# Patient Record
Sex: Female | Born: 1975
Health system: Southern US, Community
[De-identification: ages and names within clinical notes are randomized; demographics above are authoritative.]

## PROBLEM LIST (undated history)

## (undated) DIAGNOSIS — I82409 Acute embolism and thrombosis of unspecified deep veins of unspecified lower extremity: Secondary | ICD-10-CM

## (undated) DIAGNOSIS — R002 Palpitations: Secondary | ICD-10-CM

## (undated) DIAGNOSIS — O2230 Deep phlebothrombosis in pregnancy, unspecified trimester: Secondary | ICD-10-CM

## (undated) DIAGNOSIS — D259 Leiomyoma of uterus, unspecified: Secondary | ICD-10-CM

## (undated) DIAGNOSIS — M7989 Other specified soft tissue disorders: Secondary | ICD-10-CM

## (undated) HISTORY — DX: Leiomyoma of uterus, unspecified: D25.9

## (undated) HISTORY — DX: Palpitations: R00.2

## (undated) HISTORY — PX: OTHER SURGICAL HISTORY: SHX169

## (undated) HISTORY — PX: ABDOMINAL AORTA STENT: SHX1108

## (undated) HISTORY — DX: Deep phlebothrombosis in pregnancy, unspecified trimester: O22.30

---

## 2001-04-24 HISTORY — PX: THROMBECTOMY: PRO61

## 2007-06-05 ENCOUNTER — Other Ambulatory Visit: Admission: RE | Admit: 2007-06-05 | Discharge: 2007-06-05 | Payer: Self-pay | Admitting: Obstetrics and Gynecology

## 2008-07-21 ENCOUNTER — Ambulatory Visit: Payer: Self-pay | Admitting: Family Medicine

## 2008-07-21 DIAGNOSIS — D119 Benign neoplasm of major salivary gland, unspecified: Secondary | ICD-10-CM | POA: Insufficient documentation

## 2008-07-21 DIAGNOSIS — H0019 Chalazion unspecified eye, unspecified eyelid: Secondary | ICD-10-CM | POA: Insufficient documentation

## 2008-10-01 ENCOUNTER — Telehealth: Payer: Self-pay | Admitting: Family Medicine

## 2008-10-09 ENCOUNTER — Ambulatory Visit: Payer: Self-pay | Admitting: Family Medicine

## 2008-10-09 LAB — CONVERTED CEMR LAB
Albumin: 3.9 g/dL (ref 3.5–5.2)
Bilirubin Urine: NEGATIVE
Calcium: 8.9 mg/dL (ref 8.4–10.5)
Creatinine, Ser: 0.6 mg/dL (ref 0.4–1.2)
Eosinophils Absolute: 0 10*3/uL (ref 0.0–0.7)
Eosinophils Relative: 0.8 % (ref 0.0–5.0)
GFR calc non Af Amer: 120.6 mL/min (ref >60)
Glucose, Urine, Semiquant: NEGATIVE
HCT: 35.3 % — ABNORMAL LOW (ref 36.0–46.0)
HDL: 40.3 mg/dL (ref >39.00)
Hemoglobin: 12 g/dL (ref 12.0–15.0)
Lymphocytes Relative: 20.5 % (ref 12.0–46.0)
Monocytes Relative: 6.9 % (ref 3.0–12.0)
Neutro Abs: 4.3 10*3/uL (ref 1.4–7.7)
Neutrophils Relative %: 71.6 % (ref 43.0–77.0)
Platelets: 216 10*3/uL (ref 150.0–400.0)
Potassium: 3.4 meq/L — ABNORMAL LOW (ref 3.5–5.1)
RBC: 3.97 M/uL (ref 3.87–5.11)
Sodium: 137 meq/L (ref 135–145)
TSH: 0.83 microintl units/mL (ref 0.35–5.50)
Total CHOL/HDL Ratio: 3
Total Protein: 6.8 g/dL (ref 6.0–8.3)
Triglycerides: 48 mg/dL (ref 0.0–149.0)
VLDL: 9.6 mg/dL (ref 0.0–40.0)

## 2008-10-16 ENCOUNTER — Ambulatory Visit: Payer: Self-pay | Admitting: Family Medicine

## 2008-10-23 ENCOUNTER — Encounter: Payer: Self-pay | Admitting: Family Medicine

## 2008-10-23 ENCOUNTER — Ambulatory Visit: Payer: Self-pay | Admitting: Family Medicine

## 2008-10-23 DIAGNOSIS — D235 Other benign neoplasm of skin of trunk: Secondary | ICD-10-CM | POA: Insufficient documentation

## 2008-10-23 DIAGNOSIS — H6120 Impacted cerumen, unspecified ear: Secondary | ICD-10-CM | POA: Insufficient documentation

## 2009-03-02 ENCOUNTER — Ambulatory Visit (HOSPITAL_COMMUNITY): Admission: RE | Admit: 2009-03-02 | Discharge: 2009-03-02 | Payer: Self-pay | Admitting: Obstetrics and Gynecology

## 2009-06-07 ENCOUNTER — Inpatient Hospital Stay (HOSPITAL_COMMUNITY): Admission: AD | Admit: 2009-06-07 | Discharge: 2009-06-09 | Payer: Self-pay | Admitting: Internal Medicine

## 2009-06-22 DIAGNOSIS — O2230 Deep phlebothrombosis in pregnancy, unspecified trimester: Secondary | ICD-10-CM

## 2009-06-22 HISTORY — DX: Deep phlebothrombosis in pregnancy, unspecified trimester: O22.30

## 2009-07-12 ENCOUNTER — Inpatient Hospital Stay (HOSPITAL_COMMUNITY): Admission: AD | Admit: 2009-07-12 | Discharge: 2009-07-23 | Payer: Self-pay | Admitting: Obstetrics and Gynecology

## 2009-07-12 ENCOUNTER — Encounter (INDEPENDENT_AMBULATORY_CARE_PROVIDER_SITE_OTHER): Payer: Self-pay | Admitting: Obstetrics and Gynecology

## 2009-07-12 ENCOUNTER — Ambulatory Visit: Payer: Self-pay | Admitting: Vascular Surgery

## 2009-07-13 ENCOUNTER — Ambulatory Visit: Payer: Self-pay | Admitting: Internal Medicine

## 2009-07-15 ENCOUNTER — Encounter: Payer: Self-pay | Admitting: Diagnostic Radiology

## 2009-07-22 ENCOUNTER — Encounter: Payer: Self-pay | Admitting: Internal Medicine

## 2009-07-23 ENCOUNTER — Telehealth: Payer: Self-pay | Admitting: Internal Medicine

## 2009-07-26 ENCOUNTER — Ambulatory Visit: Payer: Self-pay | Admitting: Internal Medicine

## 2009-07-26 DIAGNOSIS — I82409 Acute embolism and thrombosis of unspecified deep veins of unspecified lower extremity: Secondary | ICD-10-CM | POA: Insufficient documentation

## 2009-07-26 DIAGNOSIS — L03119 Cellulitis of unspecified part of limb: Secondary | ICD-10-CM

## 2009-07-26 DIAGNOSIS — Z87448 Personal history of other diseases of urinary system: Secondary | ICD-10-CM | POA: Insufficient documentation

## 2009-07-26 DIAGNOSIS — D7582 Heparin induced thrombocytopenia (HIT): Secondary | ICD-10-CM

## 2009-07-26 DIAGNOSIS — D649 Anemia, unspecified: Secondary | ICD-10-CM

## 2009-07-26 DIAGNOSIS — L02419 Cutaneous abscess of limb, unspecified: Secondary | ICD-10-CM | POA: Insufficient documentation

## 2009-07-26 LAB — CONVERTED CEMR LAB
Basophils Absolute: 0 10*3/uL (ref 0.0–0.1)
Basophils Relative: 1 % (ref 0–1)
Eosinophils Absolute: 0.2 10*3/uL (ref 0.0–0.7)
Ferritin: 137 ng/mL (ref 10–291)
Hemoglobin: 8.4 g/dL — ABNORMAL LOW (ref 12.0–15.0)
Iron: 49 ug/dL (ref 42–145)
Leukocytes, UA: NEGATIVE
MCV: 87.9 fL (ref >=78.0)
Neutro Abs: 4.9 10*3/uL (ref 1.7–7.7)
Neutrophils Relative %: 71 % (ref 43–77)
Nitrite: NEGATIVE
Platelets: 226 10*3/uL (ref 150–400)
Protein, ur: NEGATIVE mg/dL
Specific Gravity, Urine: 1.017 (ref 1.005–1.0)
TIBC: 268 ug/dL (ref 250–470)
UIBC: 219 ug/dL
Urine Glucose: NEGATIVE mg/dL
Urobilinogen, UA: 0.2 (ref 0.0–1.0)
WBC: 6.9 10*3/uL (ref 4.0–10.5)
pH: 6.5 (ref 5.0–8.0)

## 2009-07-30 ENCOUNTER — Ambulatory Visit: Payer: Self-pay | Admitting: Internal Medicine

## 2009-08-05 ENCOUNTER — Ambulatory Visit: Payer: Self-pay | Admitting: Internal Medicine

## 2009-08-16 ENCOUNTER — Ambulatory Visit: Payer: Self-pay | Admitting: Internal Medicine

## 2009-08-23 ENCOUNTER — Ambulatory Visit: Payer: Self-pay | Admitting: Internal Medicine

## 2009-08-23 LAB — CONVERTED CEMR LAB: INR: 1.9

## 2009-08-30 ENCOUNTER — Ambulatory Visit: Payer: Self-pay | Admitting: Internal Medicine

## 2009-08-30 ENCOUNTER — Encounter: Payer: Self-pay | Admitting: Pharmacist

## 2009-08-30 LAB — CONVERTED CEMR LAB
INR: 4.1
INR: 4.1

## 2009-09-06 ENCOUNTER — Ambulatory Visit: Payer: Self-pay | Admitting: Internal Medicine

## 2009-09-06 ENCOUNTER — Ambulatory Visit (HOSPITAL_COMMUNITY): Admission: RE | Admit: 2009-09-06 | Discharge: 2009-09-06 | Payer: Self-pay | Admitting: Internal Medicine

## 2009-09-06 LAB — CONVERTED CEMR LAB
BUN: 12 mg/dL (ref 6–23)
Chloride: 105 meq/L (ref 96–112)
Creatinine, Ser: 0.65 mg/dL (ref 0.40–1.20)
Eosinophils Relative: 1 % (ref 0–5)
Lymphocytes Relative: 28 % (ref 12–46)
MCHC: 34.1 g/dL (ref 30.0–36.0)
MCV: 85.3 fL (ref >=78.0)
Monocytes Absolute: 0.2 10*3/uL (ref 0.1–1.0)
Monocytes Relative: 5 % (ref 3–12)
Neutro Abs: 2.7 10*3/uL (ref 1.7–7.7)
Potassium: 3.7 meq/L (ref 3.5–5.3)

## 2009-09-13 ENCOUNTER — Ambulatory Visit: Payer: Self-pay | Admitting: Internal Medicine

## 2009-09-13 LAB — CONVERTED CEMR LAB: INR: 3.3

## 2009-09-27 ENCOUNTER — Ambulatory Visit: Payer: Self-pay | Admitting: Internal Medicine

## 2009-10-11 ENCOUNTER — Ambulatory Visit: Payer: Self-pay | Admitting: Internal Medicine

## 2009-10-11 LAB — CONVERTED CEMR LAB: INR: 3

## 2009-11-01 ENCOUNTER — Ambulatory Visit: Payer: Self-pay | Admitting: Internal Medicine

## 2009-11-01 LAB — CONVERTED CEMR LAB: INR: 2

## 2009-11-22 ENCOUNTER — Ambulatory Visit: Payer: Self-pay | Admitting: Internal Medicine

## 2009-12-13 ENCOUNTER — Ambulatory Visit: Payer: Self-pay | Admitting: Internal Medicine

## 2009-12-13 LAB — CONVERTED CEMR LAB

## 2010-01-03 ENCOUNTER — Ambulatory Visit: Payer: Self-pay | Admitting: Internal Medicine

## 2010-01-03 LAB — CONVERTED CEMR LAB: INR: 2.6

## 2010-01-31 ENCOUNTER — Ambulatory Visit: Payer: Self-pay | Admitting: Internal Medicine

## 2010-01-31 DIAGNOSIS — R002 Palpitations: Secondary | ICD-10-CM

## 2010-01-31 HISTORY — DX: Palpitations: R00.2

## 2010-02-28 ENCOUNTER — Ambulatory Visit: Payer: Self-pay | Admitting: Internal Medicine

## 2010-02-28 LAB — CONVERTED CEMR LAB: INR: 1.9

## 2010-03-21 ENCOUNTER — Ambulatory Visit: Payer: Self-pay | Admitting: Internal Medicine

## 2010-03-21 ENCOUNTER — Encounter: Payer: Self-pay | Admitting: Pharmacist

## 2010-04-25 ENCOUNTER — Ambulatory Visit: Payer: Self-pay | Admitting: Internal Medicine

## 2010-04-25 LAB — CONVERTED CEMR LAB: INR: 2.6

## 2010-05-10 DIAGNOSIS — I82409 Acute embolism and thrombosis of unspecified deep veins of unspecified lower extremity: Secondary | ICD-10-CM

## 2010-05-10 DIAGNOSIS — I82402 Acute embolism and thrombosis of unspecified deep veins of left lower extremity: Secondary | ICD-10-CM | POA: Insufficient documentation

## 2010-05-10 DIAGNOSIS — Z7901 Long term (current) use of anticoagulants: Secondary | ICD-10-CM | POA: Insufficient documentation

## 2010-05-23 ENCOUNTER — Ambulatory Visit: Admission: RE | Admit: 2010-05-23 | Discharge: 2010-05-23 | Payer: Self-pay | Source: Home / Self Care

## 2010-05-23 LAB — CONVERTED CEMR LAB

## 2010-05-24 NOTE — Assessment & Plan Note (Signed)
Summary: 261/CFB  Anticoagulant Therapy Managed by: Barbera Setters. Alexandria Lodge III  PharmD CACP OPC Attending: Josem Kaufmann MD, Lawrence Indication 1: Deep vein thrombus Indication 2: Encounter for therapeutic drug monitoring  V58.83 Start date: 07/13/2009 Duration: 1 year  Patient Assessment Reviewed by: Chancy Milroy PharmD  July 26, 2009 Medication review: verified warfarin dosage & schedule,verified previous prescription medications, verified doses & any changes, verified new medications, reviewed OTC medications, reviewed OTC health products-vitamins supplements etc Complications: none Dietary changes: none   Health status changes: none   Lifestyle changes: none   Recent/future hospitalizations: yes  Details: Hospitalized 07-13-09 to 07-23-09 for extensive VTE after C-Section 4-5 weeks prior. 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 Recent/future procedures: none   Recent/future dental: none Patient Assessment Part 2:  Have you MISSED ANY DOSES or CHANGED TABLETS?  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 Her value to day is 226,000 platelets. Will RESUME warfarin.  Have you had any BRUISING or BLEEDING ( nose or gum bleeds,blood in urine or stool)?  No reported bruising or bleeding in nose, gums, urine, stool.  Have you STARTED or STOPPED any MEDICATIONS, including OTC meds,herbals or supplements?  No other medications or herbal supplements were started or stopped.  Have you CHANGED your DIET, especially green vegetables,or ALCOHOL intake?  No changes in diet or alcohol intake.  Have you had any ILLNESSES or HOSPITALIZATIONS?  YES. See note(s).  Have you had any signs of CLOTTING?(chest discomfort,dizziness,shortness of breath,arms tingling,slurred speech,swelling or redness in leg)    No chest  discomfort, dizziness, shortness of breath, tingling in arm, slurred speech, swelling, or redness in leg.     Treatment  Target INR: 2.0-3.0 INR: 1.45  Date: 07/26/2009 INR reflects regimen in: 1.45  New  Tablet strength: : 5mg  Regimen Out:     Sunday: 1 Tablet     Monday: 1 Tablet     Tuesday: 1 Tablet     Wednesday: 1 Tablet     Thursday: 1 Tablet      Friday: 1 Tablet     Saturday: 1 Tablet Total Weekly: 35.0mg /week mg  Next INR Due: 07/30/2009 Adjusted by: Barbera Setters. Alexandria Lodge III PharmD CACP   Return to anticoagulation clinic:  07/30/2009 Time of next visit: 1100   Comments: Patient and husband counseled extensively regarding warfarin and absolute requirement for medically recognized means of contraception to avoid conception/pregnancy while taking warfarin due to its potential embyopathic/teratogenic potential especially during weeks 6-12 of conception. They are understanding of this and have discussed use of condom(s) and his potential for a vasectomy. He will discuss this with his PCP.

## 2010-05-24 NOTE — Assessment & Plan Note (Signed)
Summary: COU/CH  Anticoagulant Therapy Managed by: Barbera Setters. Alexandria Lodge III  PharmD CACP OPC Attending: Margarito Liner MD Indication 1: Deep vein thrombus Indication 2: Encounter for therapeutic drug monitoring  V58.83 Start date: 07/13/2009 Duration: 1 year  Patient Assessment Reviewed by: Chancy Milroy PharmD  January 03, 2010 Medication review: verified warfarin dosage & schedule,verified previous prescription medications, verified doses & any changes, verified new medications, reviewed OTC medications, reviewed OTC health products-vitamins supplements etc Complications: none Dietary changes: none   Health status changes: none   Lifestyle changes: none   Recent/future hospitalizations: none   Recent/future procedures: none   Recent/future dental: none Patient Assessment Part 2:  Have you MISSED ANY DOSES or CHANGED TABLETS?  No missed Warfarin doses or changed tablets.  Have you had any BRUISING or BLEEDING ( nose or gum bleeds,blood in urine or stool)?  No reported bruising or bleeding in nose, gums, urine, stool.  Have you STARTED or STOPPED any MEDICATIONS, including OTC meds,herbals or supplements?  No other medications or herbal supplements were started or stopped.  Have you CHANGED your DIET, especially green vegetables,or ALCOHOL intake?  No changes in diet or alcohol intake.  Have you had any ILLNESSES or HOSPITALIZATIONS?  No reported illnesses or hospitalizations  Have you had any signs of CLOTTING?(chest discomfort,dizziness,shortness of breath,arms tingling,slurred speech,swelling or redness in leg)    No chest discomfort, dizziness, shortness of breath, tingling in arm, slurred speech, swelling, or redness in leg.     Treatment  Target INR: 2.0-3.0 INR: 2.6  Date: 01/03/2010 Regimen In:  77.5mg /week INR reflects regimen in: 2.6  New  Tablet strength: : 5mg  Regimen Out:     Sunday: 2 Tablet     Monday: 2 & 1/2 Tablet     Tuesday: 2 Tablet     Wednesday: 2 &  1/2 Tablet     Thursday: 2 Tablet      Friday: 2 & 1/2 Tablet     Saturday: 2 Tablet Total Weekly: 77.5mg /week mg  Next INR Due: 01/31/2010 Adjusted by: Barbera Setters. Alexandria Lodge III PharmD CACP   Return to anticoagulation clinic:  01/31/2010 Time of next visit: 1600   Comments: Seeing Dr. Birdena Crandall on this same day/date/time. She requested this date/day/time to coincide with the PCP visit.  Allergies: No Known Drug Allergies

## 2010-05-24 NOTE — Assessment & Plan Note (Signed)
Summary: 261/ds  Anticoagulant Therapy Managed by: Barbera Setters. Alexandria Lodge III  PharmD CACP OPC Attending: Darl Pikes, Beth Indication 1: Deep vein thrombus Indication 2: Encounter for therapeutic drug monitoring  V58.83 Start date: 07/13/2009 Duration: 1 year  Patient Assessment Reviewed by: Chancy Milroy PharmD  Sep 06, 2009 Medication review: verified warfarin dosage & schedule,verified previous prescription medications, verified doses & any changes, verified new medications, reviewed OTC medications, reviewed OTC health products-vitamins supplements etc Complications: none Dietary changes: none   Health status changes: none   Lifestyle changes: none   Recent/future hospitalizations: none   Recent/future procedures: none   Recent/future dental: none Patient Assessment Part 2:  Have you MISSED ANY DOSES or CHANGED TABLETS?  No missed Warfarin doses or changed tablets.  Have you had any BRUISING or BLEEDING ( nose or gum bleeds,blood in urine or stool)?  No reported bruising or bleeding in nose, gums, urine, stool.  Have you STARTED or STOPPED any MEDICATIONS, including OTC meds,herbals or supplements?  No other medications or herbal supplements were started or stopped.  Have you CHANGED your DIET, especially green vegetables,or ALCOHOL intake?  No changes in diet or alcohol intake.  Have you had any ILLNESSES or HOSPITALIZATIONS?  No reported illnesses or hospitalizations  Have you had any signs of CLOTTING?(chest discomfort,dizziness,shortness of breath,arms tingling,slurred speech,swelling or redness in leg)    States has had some shortness of breath---that she attributes to having had a cold about 2 weeks ago. Also has periodic cough. Denies fever. Cites periodic mid-sternal chest pain--without nausea or vomiting. No radiation of her described chest pain.    Treatment  Target INR: 2.0-3.0 INR: 2.5  Date: 09/06/2009 Regimen In:  75.0mg /week INR reflects regimen in: 2.5  New   Tablet strength: : 5mg  Regimen Out:     Sunday: 2 Tablet     Monday: 2 & 1/2 Tablet     Tuesday: 2 Tablet     Wednesday: 2 Tablet     Thursday: 2 & 1/2 Tablet      Friday: 2 Tablet     Saturday: 2 Tablet Total Weekly: 75.0mg /week mg  Next INR Due: 09/13/2009 Adjusted by: Barbera Setters. Alexandria Lodge III PharmD CACP   Return to anticoagulation clinic:  09/13/2009 Time of next visit: 1045    Allergies: No Known Drug Allergies

## 2010-05-24 NOTE — Miscellaneous (Signed)
Summary: CT Angio of Chest ordered-CP,SOB, mild, stable   Clinical Lists Changes Pateint seen this Am by Dr. Alexandria Lodge in teh coumadin clinic. She has been having intermittent SOB and mild CP for teh past day or two. Brief period of subtherapuetic INR earlier this month, She has an IVC filter and extensive lower extremity/pelvic clot burden. Plan is for long term anticoagulation. Will get Stat CT of her chest to R/O PE given her history. Instructed to pump and dispose of breast milk for 24 hours after contrast material given. Patient is currently pain free and vital signs are stable. At this point I think it is safe to manage this from our clinic and not the ED. Will follow her closely today. Julaine Fusi  DO  Sep 06, 2009 11:35 AM   Problems: Added new problem of CHEST PAIN (ICD-786.50) Orders: Added new Test order of T-Basic Metabolic Panel [phone]) - Signed Added new Test order of T-CBC w/Diff [id number]) - Signed Added new Test order of CT with Contrast (CT w/ contrast) - Signed     Process Orders Check Orders Results:     [facility]: ABN not required for this insurance Order queued for requisitioning for [facility]: Sep 06, 2009 11:31 AM  Tests Sent for requisitioning (Sep 06, 2009 11:31 AM):     09/06/2009: [facility] -- T-Basic Metabolic Panel [phone] (signed)     09/06/2009: [facility] -- [facility] w/Diff [[id number]] (signed)

## 2010-05-24 NOTE — Assessment & Plan Note (Signed)
Summary: PT COMING @930AM /[facility]  Anticoagulant Therapy Managed by: Barbera Setters. Janie Morning  PharmD CACP OPC Attending: Josem Kaufmann MD, Lawrence Indication 1: Deep vein thrombus Indication 2: Encounter for therapeutic drug monitoring  V58.83 Start date: 07/13/2009 Duration: 1 year  Patient Assessment Reviewed by: Chancy Milroy PharmD  August 17, 2009 Medication review: verified warfarin dosage & schedule,verified previous prescription medications, verified doses & any changes, verified new medications, reviewed OTC medications, reviewed OTC health products-vitamins supplements etc Complications: none Dietary changes: none   Health status changes: none   Lifestyle changes: none   Recent/future hospitalizations: none   Recent/future procedures: none   Recent/future dental: none Patient Assessment Part 2:  Have you MISSED ANY DOSES or CHANGED TABLETS?  No missed Warfarin doses or changed tablets.  Have you had any BRUISING or BLEEDING ( nose or gum bleeds,blood in urine or stool)?  No reported bruising or bleeding in nose, gums, urine, stool.  Have you STARTED or STOPPED any MEDICATIONS, including OTC meds,herbals or supplements?  No other medications or herbal supplements were started or stopped.  Have you CHANGED your DIET, especially green vegetables,or ALCOHOL intake?  No changes in diet or alcohol intake.  Have you had any ILLNESSES or HOSPITALIZATIONS?  No reported illnesses or hospitalizations  Have you had any signs of CLOTTING?(chest discomfort,dizziness,shortness of breath,arms tingling,slurred speech,swelling or redness in leg)    No chest discomfort, dizziness, shortness of breath, tingling in arm, slurred speech, swelling, or redness in leg.     Treatment  Target INR: 2.0-3.0 INR: 1.7  Date: 08/16/2009 Regimen In:  57.5mg /week INR reflects regimen in: 1.7  New  Tablet strength: : 5mg  Regimen Out:     Sunday: 2 Tablet     Monday: 1 & 1/2 Tablet     Tuesday: 2 Tablet  Wednesday: 2 Tablet     Thursday: 1 & 1/2 Tablet      Friday: 2 Tablet     Saturday: 2 Tablet Total Weekly: 65.0mg /week mg  Next INR Due: 08/23/2009 Adjusted by: Barbera Setters. Alexandria Lodge III PharmD CACP   Return to anticoagulation clinic:  08/23/2009 Time of next visit: 0930    Allergies: No Known Drug Allergies

## 2010-05-24 NOTE — Assessment & Plan Note (Signed)
Summary: COU/CH  Anticoagulant Therapy Managed by: Barbera Setters. Janie Morning  PharmD CACP OPC Attending: Rogelia Boga MD, Lanora Manis Indication 1: Deep vein thrombus Indication 2: Encounter for therapeutic drug monitoring  V58.83 Start date: 07/13/2009 Duration: 1 year  Patient Assessment Reviewed by: Chancy Milroy PharmD  October 11, 2009 Medication review: verified warfarin dosage & schedule,verified previous prescription medications, verified doses & any changes, verified new medications, reviewed OTC medications, reviewed OTC health products-vitamins supplements etc Complications: none Dietary changes: none   Health status changes: none   Lifestyle changes: none   Recent/future hospitalizations: none   Recent/future procedures: none   Recent/future dental: none Patient Assessment Part 2:  Have you MISSED ANY DOSES or CHANGED TABLETS?  No missed Warfarin doses or changed tablets.  Have you had any BRUISING or BLEEDING ( nose or gum bleeds,blood in urine or stool)?  No reported bruising or bleeding in nose, gums, urine, stool.  Have you STARTED or STOPPED any MEDICATIONS, including OTC meds,herbals or supplements?  No other medications or herbal supplements were started or stopped.  Have you CHANGED your DIET, especially green vegetables,or ALCOHOL intake?  No changes in diet or alcohol intake.  Have you had any ILLNESSES or HOSPITALIZATIONS?  No reported illnesses or hospitalizations  Have you had any signs of CLOTTING?(chest discomfort,dizziness,shortness of breath,arms tingling,slurred speech,swelling or redness in leg)    No chest discomfort, dizziness, shortness of breath, tingling in arm, slurred speech, swelling, or redness in leg.     Treatment  Target INR: 2.0-3.0 INR: 3.0  Date: 10/11/2009 Regimen In:  70.0mg /week INR reflects regimen in: 3.0  New  Tablet strength: : 5mg  Regimen Out:     Sunday: 2 Tablet     Monday: 2 Tablet     Tuesday: 2 Tablet     Wednesday: 2  Tablet     Thursday: 2 Tablet      Friday: 2 Tablet     Saturday: 2 Tablet Total Weekly: 70.0mg /week mg  Next INR Due: 11/01/2009 Adjusted by: Barbera Setters. Alexandria Lodge III PharmD CACP   Return to anticoagulation clinic:  11/01/2009 Time of next visit: 1045    Allergies: No Known Drug Allergies

## 2010-05-24 NOTE — Assessment & Plan Note (Signed)
Summary: Coumadin Clinic  Anticoagulant Therapy Managed by: Barbera Setters. Alexandria Lodge III  PharmD CACP Indication 1: Deep vein thrombus Indication 2: Encounter for therapeutic drug monitoring  V58.83 Start date: 07/13/2009 Duration: 1 year  Patient Assessment Reviewed by: Chancy Milroy PharmD  Aug 30, 2009 Medication review: verified warfarin dosage & schedule,verified previous prescription medications, verified doses & any changes, verified new medications, reviewed OTC medications, reviewed OTC health products-vitamins supplements etc Complications: none Dietary changes: none   Health status changes: none   Lifestyle changes: none   Recent/future hospitalizations: none   Recent/future procedures: none   Recent/future dental: none Patient Assessment Part 2:  Have you MISSED ANY DOSES or CHANGED TABLETS?  No missed Warfarin doses or changed tablets.  Have you had any BRUISING or BLEEDING ( nose or gum bleeds,blood in urine or stool)?  No reported bruising or bleeding in nose, gums, urine, stool.  Have you STARTED or STOPPED any MEDICATIONS, including OTC meds,herbals or supplements?  No other medications or herbal supplements were started or stopped.  Have you CHANGED your DIET, especially green vegetables,or ALCOHOL intake?  No changes in diet or alcohol intake.  Have you had any ILLNESSES or HOSPITALIZATIONS?  No reported illnesses or hospitalizations  Have you had any signs of CLOTTING?(chest discomfort,dizziness,shortness of breath,arms tingling,slurred speech,swelling or redness in leg)    No chest discomfort, dizziness, shortness of breath, tingling in arm, slurred speech, swelling, or redness in leg.     Treatment  Target INR: 2.0-3.0 INR: 4.1  Date: 08/30/2009 Regimen In:  75.0mg /week INR reflects regimen in: 4.1  New  Tablet strength: : 5mg  Regimen Out:     Sunday: 2 Tablet     Monday: 2 & 1/2 Tablet     Tuesday: 2 Tablet     Wednesday: 2 Tablet     Thursday: 2 & 1/2  Tablet      Friday: 2 Tablet     Saturday: 2 Tablet Total Weekly: 75.0mg/week mg  Next INR Due: 09/06/2009 Adjusted by: Raylin Winer B. Wanita Derenzo III PharmD CACP   Return to anticoagulation clinic:  09/06/2009 Time of next visit: 1030    Allergies: No Known Drug Allergies Prescriptions: WARFARIN SODIUM 5 MG TABS (WARFARIN SODIUM) Tablet Strength: 5mg Take as directed  #100 x 3   Entered by:   Jay Arval Brandstetter PharmD   Authorized by:   Wynne E Woodyear MD   Signed by:   Jay Sophiana Milanese PharmD on 08/30/2009   Method used:   Electronically to        [facility]  [id number] (retail)       [address]       [place], [place]  [address]       Ph: [phone] or [phone]       Fax: [phone]   RxID:   [id number]

## 2010-05-24 NOTE — Progress Notes (Signed)
Summary: phone/gg  Phone Note Call from Patient   Caller: Patient Summary of Call: Pt d/c today after a 2 week stay in hospital.  S/P Pelvic deep vein thrombophlebitis. She was told to wear the wite support hose.  THey stop at her knees.  She was not given instructions about removing at night etc.  Can you please call the pt?  Pt # [id number] Initial call taken by: Merrie Roof RN,  July 23, 2009 4:16 PM  Follow-up for Phone Call        Reviewed chart.  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  Pt questioned how to use the stockings.  Arixtra is going to be more effective than a knee high stocking a preventing additional clot burden.  Pt can remove stocking and wash them then put them back on.  PT understands and has no further ?.  Pt has appt 07/26/09. Follow-up by: Blanch Media MD,  July 23, 2009 4:56 PM

## 2010-05-24 NOTE — Assessment & Plan Note (Signed)
Summary: COU/CH  Anticoagulant Therapy Managed by: Barbera Setters. Amy Cole  PharmD CACP OPC Attending: Rogelia Boga MD, Lanora Manis Indication 1: Deep vein thrombus Indication 2: Encounter for therapeutic drug monitoring  V58.83 Start date: 07/13/2009 Duration: 1 year  Patient Assessment Reviewed by: Chancy Milroy PharmD  December 13, 2009 Medication review: verified warfarin dosage & schedule,verified previous prescription medications, verified doses & any changes, verified new medications, reviewed OTC medications, reviewed OTC health products-vitamins supplements etc Complications: none Dietary changes: none   Health status changes: none   Lifestyle changes: none   Recent/future hospitalizations: none   Recent/future procedures: none   Recent/future dental: none Patient Assessment Part 2:  Have you MISSED ANY DOSES or CHANGED TABLETS?  No missed Warfarin doses or changed tablets.  Have you had any BRUISING or BLEEDING ( nose or gum bleeds,blood in urine or stool)?  No reported bruising or bleeding in nose, gums, urine, stool.  Have you STARTED or STOPPED any MEDICATIONS, including OTC meds,herbals or supplements?  No other medications or herbal supplements were started or stopped.  Have you CHANGED your DIET, especially green vegetables,or ALCOHOL intake?  No changes in diet or alcohol intake.  Have you had any ILLNESSES or HOSPITALIZATIONS?  No reported illnesses or hospitalizations  Have you had any signs of CLOTTING?(chest discomfort,dizziness,shortness of breath,arms tingling,slurred speech,swelling or redness in leg)    No chest discomfort, dizziness, shortness of breath, tingling in arm, slurred speech, swelling, or redness in leg.     Treatment  Target INR: 2.0-3.0 INR: 2.7  Date: 12/13/2009 Regimen In:  77.5mg /week INR reflects regimen in: 2.7  New  Tablet strength: : 5mg  Regimen Out:     Sunday: 2 Tablet     Monday: 2 & 1/2 Tablet     Tuesday: 2 Tablet     Wednesday:  2 & 1/2 Tablet     Thursday: 2 Tablet      Friday: 2 & 1/2 Tablet     Saturday: 2 Tablet Total Weekly: 77.5mg/week mg  Next INR Due: 01/03/2010 Adjusted by: James B. Groce III PharmD CACP   Return to anticoagulation clinic:  01/03/2010 Time of next visit: 1530    Allergies: No Known Drug Allergies Prescriptions: WARFARIN SODIUM 5 MG TABS (WARFARIN SODIUM) Tablet Strength: 5mg Take as directed  #100 x 3   Entered by:   Jay Groce PharmD   Authorized by:   Elizabeth Butcher MD   Signed by:   Jay Groce PharmD on 12/13/2009   Method used:   Electronically to        [facility] [address] (retail)       [address]       [place], [place]  [address]       Ph: [phone]       Fax: [phone]   RxID:   [id number]

## 2010-05-24 NOTE — Assessment & Plan Note (Signed)
Summary: TO SEE JAY @11AM /[facility]  Anticoagulant Therapy Managed by: Barbera Setters. Janie Morning  PharmD CACP OPC Attending: Lowella Bandy MD Indication 1: Deep vein thrombus Indication 2: Encounter for therapeutic drug monitoring  V58.83 Start date: 07/13/2009 Duration: 1 year  Patient Assessment Reviewed by: Chancy Milroy PharmD  August 05, 2009 Medication review: verified warfarin dosage & schedule,verified previous prescription medications, verified doses & any changes, verified new medications, reviewed OTC medications, reviewed OTC health products-vitamins supplements etc Complications: none Dietary changes: none   Health status changes: none   Lifestyle changes: none   Recent/future hospitalizations: none   Recent/future procedures: none   Recent/future dental: none Patient Assessment Part 2:  Have you MISSED ANY DOSES or CHANGED TABLETS?  No missed Warfarin doses or changed tablets.  Have you had any BRUISING or BLEEDING ( nose or gum bleeds,blood in urine or stool)?  No reported bruising or bleeding in nose, gums, urine, stool.  Have you STARTED or STOPPED any MEDICATIONS, including OTC meds,herbals or supplements?  No other medications or herbal supplements were started or stopped.  Have you CHANGED your DIET, especially green vegetables,or ALCOHOL intake?  No changes in diet or alcohol intake.  Have you had any ILLNESSES or HOSPITALIZATIONS?  No reported illnesses or hospitalizations  Have you had any signs of CLOTTING?(chest discomfort,dizziness,shortness of breath,arms tingling,slurred speech,swelling or redness in leg)    No chest discomfort, dizziness, shortness of breath, tingling in arm, slurred speech, swelling, or redness in leg.     Treatment  Target INR: 2.0-3.0 INR: 2.0  Date: 08/05/2009 Regimen In:  42.5mg /week INR reflects regimen in: 2.0  New  Tablet strength: : 5mg  Regimen Out:     Sunday: 1 & 1/2 Tablet     Monday: 2 Tablet     Tuesday: 1 & 1/2 Tablet    Wednesday: 1 & 1/2 Tablet     Thursday: 2 Tablet      Friday: 1 & 1/2 Tablet     Saturday: 1 & 1/2 Tablet Total Weekly: 57.5mg /week mg  Next INR Due: 08/16/2009 Adjusted by: Barbera Setters. Alexandria Lodge III PharmD CACP   Return to anticoagulation clinic:  08/16/2009 Time of next visit: 0930   Comments: Patient has been on concomittant Arixtra (fondaparinux) + warfarin s/p long hospitalization complicated by HIT after having thrombolysis and thrombectomy and filter insertion due to massive ileo-femoral VTE which occurred 5 weeks post-partum (C-Section). In response to HIT--we had to discontinue her warfarin until such time that the PLCT became > 150,000. When that endpoint was met--we recommenced warfarin at "go-low/go-slow" recommendation per CHEST. She has now had her first therapeutic INR with the dosing by this strategy in CHEST. She has 3 remaining Arixtra syringes (7.5mg ) which she will administer subcutaneously once daily for 3 more days. Her warfarin dose is being adjusted UP. She has been counseled extensively regarding necessity for medically recognized means of contraception while now on an embryopathic/teratogenic drug (warfarin) and still being of child-bearing potential. She understands this and acknowledges the same. She understands that after 3-5 days OFF of Arixtra, she can recommence breast-feeding.   Allergies: No Known Drug Allergies

## 2010-05-24 NOTE — Assessment & Plan Note (Signed)
Summary: COU/CH  Anticoagulant Therapy Managed by: Barbera Setters. Alexandria Lodge III  PharmD CACP OPC Attending: Margarito Liner MD Indication 1: Deep vein thrombus Indication 2: Encounter for therapeutic drug monitoring  V58.83 Start date: 07/13/2009 Duration: 1 year  Patient Assessment Reviewed by: Chancy Milroy PharmD  November 01, 2009 Medication review: verified warfarin dosage & schedule,verified previous prescription medications, verified doses & any changes, verified new medications, reviewed OTC medications, reviewed OTC health products-vitamins supplements etc Complications: none Dietary changes: none   Health status changes: none   Lifestyle changes: none   Recent/future hospitalizations: none   Recent/future procedures: none   Recent/future dental: none Patient Assessment Part 2:  Have you MISSED ANY DOSES or CHANGED TABLETS?  No missed Warfarin doses or changed tablets.  Have you had any BRUISING or BLEEDING ( nose or gum bleeds,blood in urine or stool)?  No reported bruising or bleeding in nose, gums, urine, stool.  Have you STARTED or STOPPED any MEDICATIONS, including OTC meds,herbals or supplements?  No other medications or herbal supplements were started or stopped.  Have you CHANGED your DIET, especially green vegetables,or ALCOHOL intake?  No changes in diet or alcohol intake.  Have you had any ILLNESSES or HOSPITALIZATIONS?  No reported illnesses or hospitalizations  Have you had any signs of CLOTTING?(chest discomfort,dizziness,shortness of breath,arms tingling,slurred speech,swelling or redness in leg)    No chest discomfort, dizziness, shortness of breath, tingling in arm, slurred speech, swelling, or redness in leg.     Treatment  Target INR: 2.0-3.0 INR: 2.0  Date: 11/01/2009 Regimen In:  70.0mg /week INR reflects regimen in: 2.0  New  Tablet strength: : 5mg  Regimen Out:     Sunday: 2 Tablet     Monday: 2 & 1/2 Tablet     Tuesday: 2 Tablet     Wednesday: 2 & 1/2  Tablet     Thursday: 2 Tablet      Friday: 2 & 1/2 Tablet     Saturday: 2 Tablet Total Weekly: 77.5mg /week mg  Next INR Due: 11/22/2009 Adjusted by: Barbera Setters. Alexandria Lodge III PharmD CACP   Return to anticoagulation clinic:  11/22/2009 Time of next visit: 0945    Allergies: No Known Drug Allergies

## 2010-05-24 NOTE — Assessment & Plan Note (Signed)
Summary: NEW HFU- PER AI MAYMANI/[facility]   Vital Signs:  Patient profile:   35 year old female Height:      61 inches (154.94 cm) Weight:      134.6 pounds (61.18 kg) BMI:     25.52 Temp:     98.3 degrees F (36.83 degrees C) oral Pulse rate:   80 / minute BP sitting:   110 / 69  (left arm) Cuff size:   regular  Vitals Entered By: Cynda Familia Duncan Dull) (July 26, 2009 10:47 AM) CC: HFU-had pain earlier this am in left leg which has now resoved after taking tylenol Is Patient Diabetic? No Pain Assessment Patient in pain? no      Nutritional Status BMI of 25 - 29 = overweight  Have you ever been in a relationship where you felt threatened, hurt or afraid?No   Does patient need assistance? Functional Status Self care Ambulation Normal   CC:  HFU-had pain earlier this am in left leg which has now resoved after taking tylenol.  History of Present Illness: 35 yo f with PMH, UTI, Cellulitis, Thrombophlebitis (deep pelvic) s/p lysis, HIT, Anemia.  Presents to clinic for labs and hospital f/u  Today the patient is feeling well and had no new complaints. She reports improvement of L leg swelling and said that R leg swelling has resolved.  She also still has some night sweats, however denies fever, chills.   Depression History:      The patient denies a depressed mood most of the day and a diminished interest in her usual daily activities.         Preventive Screening-Counseling & Management  Alcohol-Tobacco     Smoking Status: never  Current Problems (verified): 1)  Uti's, Hx of  (ICD-V13.00) 2)  Cellulitis, Thigh, Left  (ICD-682.6) 3)  Deep Venous Thrombophlebitis  (ICD-453.40) 4)  Heparin-induced Thrombocytopenia  (ICD-289.84) 5)  Unspecified Anemia  (ICD-285.9)  Current Medications (verified): 1)  Arixtra 7.5 Mg/0.34ml Soln (Fondaparinux Sodium) .... Inject Once Daily 2)  Warfarin Sodium 5 Mg Tabs (Warfarin Sodium) .... Take As Directed.  Allergies (verified): No  Known Drug Allergies  Social History: Smoking Status:  never  Review of Systems       per HPI   Physical Exam  General:  alert, well-developed, and cooperative to examination.    Head:  normocephalic and atraumatic.    Eyes:  vision grossly intact, pupils equal, pupils round, pupils reactive to light, no injection and anicteric.    Ears:  R ear normal and L ear normal.   Nose:  no external deformity.   Mouth:  good dentition, no dental plaque, and pharynx pink and moist.   Neck:  supple, full ROM, no thyromegaly, no JVD, and no carotid bruits.    Lungs:  normal respiratory effort, no accessory muscle use, normal breath sounds, no crackles, and no wheezes.  Heart:  normal rate, regular rhythm, no murmur, no gallop, and no rub.    Abdomen:  soft, non-tender, normal bowel sounds, no distention, no guarding, no rebound tenderness, no hepatomegaly, and no splenomegaly.    CS scar present.  Msk:  no joint swelling, no joint warmth, and no redness over joints.    Pulses:  2+ DP/PT pulses bilaterally  Extremities:  No cyanosis, or erythema, L leg edema 2+.  Neurologic:  alert & oriented X3, cranial nerves II-XII intact, strength normal in all extremities, sensation intact to light touch, and gait normal.  Skin:   turgor normal and no rashes.  Psych:  Oriented X3, memory intact for recent and remote, normally interactive, good eye contact, not anxious appearing, and not depressed appearing.    Impression & Recommendations:  Problem # 1:  DEEP VENOUS THROMBOPHLEBITIS (ICD-453.40) thought to be caused by pregnancy/C section.  s/p clot lysis by IR at [facility], Patient will be placed on coumadin again (as platelets are >200 today) and with LMWH bridge for 1 week.  Patient will need treatment for 6 months, then will d/c, and consider doing hypercoag panel 3 months after d/c due to extent of the clot. Patient will follow with Dr. Alexandria Lodge for INR and warfarin dosage.   Orders: T-Protime, Auto  ([id number])  Problem # 2:  HEPARIN-INDUCED THROMBOCYTOPENIA (ICD-289.84) Per 1, platelets up to >200 K.  HIT panel still pending.  Orders: T-Protime, Auto ([id number]) T-CBC w/Diff ([id number])  Problem # 3:  CELLULITIS, THIGH, LEFT (ICD-682.6) Resolved.   Problem # 4:  UTI'S, HX OF (ICD-V13.00) denies dysuria, will recheck UA.   Orders: T-Urinalysis ([id number])  Problem # 5:  UNSPECIFIED ANEMIA (ICD-285.9) Hg today 8.2, no sign of active bleeding. will order ferritin, iron, tibc today. will follow Orders: T-Ferritin [phone]) T-Iron [phone]) T-Iron Binding Capacity (TIBC) ([id number]) T-CBC w/Diff ([id number])  Complete Medication List: 1)  Arixtra 7.5 Mg/0.4ml Soln (Fondaparinux sodium) .... Inject once daily 2)  Warfarin Sodium 5 Mg Tabs (Warfarin sodium) .... Take as directed.  Patient Instructions: 1)  Please schedule a follow-up appointment in 3 months. Prescriptions: WARFARIN SODIUM 5 MG TABS (WARFARIN SODIUM) Take as directed.  #30 x 2   Entered by:   Chancy Milroy PharmD   Authorized by:   Darnelle Maffucci MD   Signed by:   Chancy Milroy PharmD on 07/26/2009   Method used:   Electronically to        [facility]  [address] [phone]* (retail)       [address]       [place], [place]  [address]       Ph: [phone] or [phone]       Fax: [phone]   RxID:   [id number]   Process Orders Check Orders Results:     [facility]: ABN not required for this insurance Tests Sent for requisitioning (July 26, 2009 5:34 PM):     07/26/2009: [facility] -- T-Urinalysis [[id number]] (signed)     07/26/2009: [facility] -- T-Ferritin [phone] (signed)     07/26/2009: [facility] -- Augusto Gamble [[id number]] (signed)     07/26/2009: [facility] -- T-Iron Binding Capacity (TIBC) [[id number]] (signed)     07/26/2009: [facility] -- T-Protime, Scarlette Calico [[id number]]  (signed)     07/26/2009: [facility] -- T-CBC w/Diff [[id number]] (signed)     Prevention & Chronic Care Immunizations   Influenza vaccine: Not documented    Tetanus booster: Not documented    Pneumococcal vaccine: Not documented  Other Screening   Pap smear: Not documented   Smoking status: never  (07/26/2009)  Lipids   Total Cholesterol: Not documented   LDL: Not documented   LDL Direct: Not documented   HDL: Not documented   Triglycerides: Not documented

## 2010-05-24 NOTE — Assessment & Plan Note (Signed)
Summary: PT COMING @11AM /[facility]  Anticoagulant Therapy Managed by: Barbera Setters. Janie Morning  PharmD CACP OPC Attending: Rogelia Boga MD, Lanora Manis Indication 1: Deep vein thrombus Indication 2: Encounter for therapeutic drug monitoring  V58.83 Start date: 07/13/2009 Duration: 1 year  Patient Assessment Reviewed by: Chancy Milroy PharmD  July 30, 2009 Medication review: verified warfarin dosage & schedule,verified previous prescription medications, verified doses & any changes, verified new medications, reviewed OTC medications, reviewed OTC health products-vitamins supplements etc Complications: none Dietary changes: none   Health status changes: none   Lifestyle changes: none   Recent/future hospitalizations: none   Recent/future procedures: none   Recent/future dental: none Patient Assessment Part 2:  Have you MISSED ANY DOSES or CHANGED TABLETS?  No missed Warfarin doses or changed tablets.  Have you had any BRUISING or BLEEDING ( nose or gum bleeds,blood in urine or stool)?  No reported bruising or bleeding in nose, gums, urine, stool.  Have you STARTED or STOPPED any MEDICATIONS, including OTC meds,herbals or supplements?  No other medications or herbal supplements were started or stopped.  Have you CHANGED your DIET, especially green vegetables,or ALCOHOL intake?  No changes in diet or alcohol intake.  Have you had any ILLNESSES or HOSPITALIZATIONS?  No reported illnesses or hospitalizations  Have you had any signs of CLOTTING?(chest discomfort,dizziness,shortness of breath,arms tingling,slurred speech,swelling or redness in leg)    No chest discomfort, dizziness, shortness of breath, tingling in arm, slurred speech, swelling, or redness in leg.     Treatment  Target INR: 2.0-3.0 INR: 1.2  Date: 07/30/2009 Regimen In:  35.0mg /week INR reflects regimen in: 1.2  New  Tablet strength: : 5mg  Regimen Out:     Sunday: 1 Tablet     Monday: 1 & 1/2 Tablet     Tuesday: 1 Tablet  Wednesday: 1 & 1/2 Tablet     Thursday: 1 Tablet      Friday: 1 & 1/2 Tablet     Saturday: 1 Tablet Total Weekly: 42.5mg /week mg  Next INR Due: 08/05/2009 Adjusted by: Barbera Setters. Alexandria Lodge III PharmD CACP   Return to anticoagulation clinic:  08/05/2009 Time of next visit: 1100   Comments: Continues upon Arixtra 7.5mg  subcutaneously once daily until such time INR is therapeutic.  Allergies: No Known Drug Allergies

## 2010-05-24 NOTE — Assessment & Plan Note (Signed)
Summary: COU/CH  Anticoagulant Therapy Managed by: Barbera Setters. Janie Morning  PharmD CACP OPC Attending: Coralee Pesa MD, Levada Schilling Indication 1: Deep vein thrombus Indication 2: Encounter for therapeutic drug monitoring  V58.83 Start date: 07/13/2009 Duration: 1 year  Patient Assessment Reviewed by: Chancy Milroy PharmD  Aug 30, 2009 Medication review: verified warfarin dosage & schedule,verified previous prescription medications, verified doses & any changes, verified new medications, reviewed OTC medications, reviewed OTC health products-vitamins supplements etc Complications: none Dietary changes: none   Health status changes: none   Lifestyle changes: none   Recent/future hospitalizations: none   Recent/future procedures: none   Recent/future dental: none Patient Assessment Part 2:  Have you MISSED ANY DOSES or CHANGED TABLETS?  No missed Warfarin doses or changed tablets.  Have you had any BRUISING or BLEEDING ( nose or gum bleeds,blood in urine or stool)?  No reported bruising or bleeding in nose, gums, urine, stool.  Have you STARTED or STOPPED any MEDICATIONS, including OTC meds,herbals or supplements?  No other medications or herbal supplements were started or stopped.  Have you CHANGED your DIET, especially green vegetables,or ALCOHOL intake?  No changes in diet or alcohol intake.  Have you had any ILLNESSES or HOSPITALIZATIONS?  No reported illnesses or hospitalizations  Have you had any signs of CLOTTING?(chest discomfort,dizziness,shortness of breath,arms tingling,slurred speech,swelling or redness in leg)    No chest discomfort, dizziness, shortness of breath, tingling in arm, slurred speech, swelling, or redness in leg.     Treatment  Target INR: 2.0-3.0 INR: 4.1  Date: 08/30/2009 Regimen In:  77.5mg /week INR reflects regimen in: 4.1  New  Tablet strength: : 5mg  Regimen Out:     Sunday: 2 Tablet     Monday: 2 & 1/2 Tablet     Tuesday: 2 Tablet     Wednesday: 2  Tablet     Thursday: 2 & 1/2 Tablet      Friday: 2 Tablet     Saturday: 2 Tablet Total Weekly: 75.0mg /week mg  Next INR Due: 09/06/2009 Adjusted by: Barbera Setters. Alexandria Lodge III PharmD CACP   Return to anticoagulation clinic:  09/06/2009 Time of next visit: 1030    Allergies: No Known Drug Allergies

## 2010-05-24 NOTE — Assessment & Plan Note (Signed)
Summary: COU/CH  Anticoagulant Therapy Managed by: Barbera Setters. Alexandria Lodge III  PharmD CACP OPC Attending: Darl Pikes, Beth Indication 1: Deep vein thrombus Indication 2: Encounter for therapeutic drug monitoring  V58.83 Start date: 07/13/2009 Duration: 1 year  Patient Assessment Reviewed by: Chancy Milroy PharmD  Sep 13, 2009 Medication review: verified warfarin dosage & schedule,verified previous prescription medications, verified doses & any changes, verified new medications, reviewed OTC medications, reviewed OTC health products-vitamins supplements etc Complications: none Dietary changes: none   Health status changes: none   Lifestyle changes: none   Recent/future hospitalizations: none   Recent/future procedures: none   Recent/future dental: none Patient Assessment Part 2:  Have you MISSED ANY DOSES or CHANGED TABLETS?  No missed Warfarin doses or changed tablets.  Have you had any BRUISING or BLEEDING ( nose or gum bleeds,blood in urine or stool)?  No reported bruising or bleeding in nose, gums, urine, stool.  Have you STARTED or STOPPED any MEDICATIONS, including OTC meds,herbals or supplements?  No other medications or herbal supplements were started or stopped.  Have you CHANGED your DIET, especially green vegetables,or ALCOHOL intake?  No changes in diet or alcohol intake.  Have you had any ILLNESSES or HOSPITALIZATIONS?  No reported illnesses or hospitalizations  Have you had any signs of CLOTTING?(chest discomfort,dizziness,shortness of breath,arms tingling,slurred speech,swelling or redness in leg)    No chest discomfort, dizziness, shortness of breath, tingling in arm, slurred speech, swelling, or redness in leg.     Treatment  Target INR: 2.0-3.0 INR: 3.3  Date: 09/13/2009 Regimen In:  75.0mg /week INR reflects regimen in: 3.3  New  Tablet strength: : 5mg  Regimen Out:     Sunday: 2 Tablet     Monday: 2 & 1/2 Tablet     Tuesday: 2 Tablet     Wednesday: 2  Tablet     Thursday: 2 & 1/2 Tablet      Friday: 2 Tablet     Saturday: 2 Tablet Total Weekly: 75.0mg /week mg  Next INR Due: 09/27/2009 Adjusted by: Barbera Setters. Alexandria Lodge III PharmD CACP   Return to anticoagulation clinic:  09/27/2009 Time of next visit: 0945    Allergies: No Known Drug Allergies

## 2010-05-24 NOTE — Assessment & Plan Note (Signed)
Summary: Coumadin Clinic  Anticoagulant Therapy Managed by: Barbera Setters. Janie Morning  PharmD CACP OPC Attending: Lowella Bandy MD Indication 1: Deep vein thrombus Indication 2: Encounter for therapeutic drug monitoring  V58.83 Start date: 07/13/2009 Duration: 1 year  Patient Assessment Reviewed by: Chancy Milroy PharmD  March 21, 2010 Medication review: verified warfarin dosage & schedule,verified previous prescription medications, verified doses & any changes, verified new medications, reviewed OTC medications, reviewed OTC health products-vitamins supplements etc Complications: none Dietary changes: none   Health status changes: none   Lifestyle changes: none   Recent/future hospitalizations: none   Recent/future procedures: none   Recent/future dental: none Patient Assessment Part 2:  Have you MISSED ANY DOSES or CHANGED TABLETS?  No missed Warfarin doses or changed tablets.  Have you had any BRUISING or BLEEDING ( nose or gum bleeds,blood in urine or stool)?  No reported bruising or bleeding in nose, gums, urine, stool.  Have you STARTED or STOPPED any MEDICATIONS, including OTC meds,herbals or supplements?  No other medications or herbal supplements were started or stopped.  Have you CHANGED your DIET, especially green vegetables,or ALCOHOL intake?  No changes in diet or alcohol intake.  Have you had any ILLNESSES or HOSPITALIZATIONS?  No reported illnesses or hospitalizations  Have you had any signs of CLOTTING?(chest discomfort,dizziness,shortness of breath,arms tingling,slurred speech,swelling or redness in leg)    No chest discomfort, dizziness, shortness of breath, tingling in arm, slurred speech, swelling, or redness in leg.     Treatment  Target INR: 2.0-3.0 INR: 2.4  Date: 03/21/2010 Regimen In:  82.5mg /week INR reflects regimen in: 2.4  New  Tablet strength: : 5mg  Regimen Out:     Sunday: 2 & 1/2 Tablet     Monday: 2 & 1/2 Tablet     Tuesday: 2 & 1/2  Tablet     Wednesday: 2 & 1/2 Tablet     Thursday: 2 & 1/2 Tablet      Friday: 2 & 1/2 Tablet     Saturday: 2 & 1/2 Tablet Total Weekly: 87.5mg /week mg  Next INR Due: 04/25/2010 Adjusted by: Barbera Setters. Alexandria Lodge III PharmD CACP   Return to anticoagulation clinic:  04/25/2010 Time of next visit: 1415    Allergies: No Known Drug Allergies

## 2010-05-24 NOTE — Miscellaneous (Signed)
Summary: Pelvic deep vein thrombophlebitis  Hospital Discharge  Date of admission: 07/13/2009  Date of discharge: 07/23/2009  Brief reason for admission/active problems:Pelvic deep vein thrombophlebitis, Urinary Tract Infection, Leg cellulitis  Followup needed: Monday 07/26/2009 at 10:30AM with Dr. Michaell Cowing in the Coumadin Clinic and Dr. Gilford Rile in the Internal Medicine Clinic. 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 Was therefore transitioned to fondapurinox. We were intially giving argatroban with coumadin in the hospital but the concern for thrombotic event (skin necrosis) was very high so we discontinued coumadin and kept her on thombin inhibitors. To assist in discharge home, we changed the argatroban to fondapurinox.   The medication and problem lists have been updated.  Please see the dictated discharge summary for details.    Prescriptions: ARIXTRA 7.5 MG/0.6ML SOLN (FONDAPARINUX SODIUM) inject once daily  #5 x 0   Entered and Authorized by:   Bethel Born MD   Signed by:   Bethel Born MD on 07/23/2009   Method used:   Print then Give to Patient   RxID:   [id number] DOXYCYCLINE HYCLATE 100 MG TABS (DOXYCYCLINE HYCLATE) Take 1 tablet by mouth two times a day until 07/25/2009.  #8 tablets x 0   Entered and Authorized by:   Bethel Born MD   Signed by:   Bethel Born MD on 07/22/2009   Method used:   Print then Give to Patient   RxID:   [id number]    Patient Instructions: 1)  Follow up appointment with [facility]. Monday 07/26/2009 at 10:30AM with Dr. Michaell Cowing in the Coumadin Clinic and Dr. Gilford Rile in the Internal Medicine Clinic.

## 2010-05-24 NOTE — Assessment & Plan Note (Signed)
Summary: 945AM COU APPT/[facility]  Anticoagulant Therapy Managed by: Barbera Setters. Janie Morning  PharmD CACP OPC Attending: Coralee Pesa MD, Joen Laura  Indication 1: Deep vein thrombus Indication 2: Encounter for therapeutic drug monitoring  V58.83 Start date: 07/13/2009 Duration: 1 year  Patient Assessment Reviewed by: Chancy Milroy PharmD  November 22, 2009 Medication review: verified warfarin dosage & schedule,verified previous prescription medications, verified doses & any changes, verified new medications, reviewed OTC medications, reviewed OTC health products-vitamins supplements etc Complications: none Dietary changes: none   Health status changes: none   Lifestyle changes: none   Recent/future hospitalizations: none   Recent/future procedures: none   Recent/future dental: none Patient Assessment Part 2:  Have you MISSED ANY DOSES or CHANGED TABLETS?  No missed Warfarin doses or changed tablets.  Have you had any BRUISING or BLEEDING ( nose or gum bleeds,blood in urine or stool)?  No reported bruising or bleeding in nose, gums, urine, stool.  Have you STARTED or STOPPED any MEDICATIONS, including OTC meds,herbals or supplements?  No other medications or herbal supplements were started or stopped.  Have you CHANGED your DIET, especially green vegetables,or ALCOHOL intake?  No changes in diet or alcohol intake.  Have you had any ILLNESSES or HOSPITALIZATIONS?  No reported illnesses or hospitalizations  Have you had any signs of CLOTTING?(chest discomfort,dizziness,shortness of breath,arms tingling,slurred speech,swelling or redness in leg)    No chest discomfort, dizziness, shortness of breath, tingling in arm, slurred speech, swelling, or redness in leg.     Treatment  Target INR: 2.0-3.0 INR: 2.80  Date: 11/22/2009 Regimen In:  77.5mg /week INR reflects regimen in: 2.80  New  Tablet strength: : 5mg  Regimen Out:     Sunday: 2 Tablet     Monday: 2 & 1/2 Tablet     Tuesday: 2 Tablet  Wednesday: 2 & 1/2 Tablet     Thursday: 2 Tablet      Friday: 2 & 1/2 Tablet     Saturday: 2 Tablet Total Weekly: 77.5mg /week mg  Next INR Due: 12/13/2009 Adjusted by: Barbera Setters. Alexandria Lodge III PharmD CACP   Return to anticoagulation clinic:  12/13/2009 Time of next visit: 1500    Allergies: No Known Drug Allergies

## 2010-05-24 NOTE — Assessment & Plan Note (Signed)
Summary: COU/CH  Anticoagulant Therapy Managed by: Barbera Setters. Janie Morning  PharmD CACP [facility] Attending: Rogelia Boga MD, Lanora Manis Indication 1: Deep vein thrombus Indication 2: Encounter for therapeutic drug monitoring  V58.83 Start date: 07/13/2009 Duration: 1 year  Patient Assessment Reviewed by: Chancy Milroy PharmD  February 28, 2010 Medication review: verified warfarin dosage & schedule,verified previous prescription medications, verified doses & any changes, verified new medications, reviewed OTC medications, reviewed OTC health products-vitamins supplements etc Complications: none Dietary changes: none   Health status changes: none   Lifestyle changes: none   Recent/future hospitalizations: none   Recent/future procedures: none   Recent/future dental: none Patient Assessment Part 2:  Have you MISSED ANY DOSES or CHANGED TABLETS?  No missed Warfarin doses or changed tablets.  Have you had any BRUISING or BLEEDING ( nose or gum bleeds,blood in urine or stool)?  No reported bruising or bleeding in nose, gums, urine, stool.  Have you STARTED or STOPPED any MEDICATIONS, including OTC meds,herbals or supplements?  No other medications or herbal supplements were started or stopped.  Have you CHANGED your DIET, especially green vegetables,or ALCOHOL intake?  No changes in diet or alcohol intake.  Have you had any ILLNESSES or HOSPITALIZATIONS?  No reported illnesses or hospitalizations  Have you had any signs of CLOTTING?(chest discomfort,dizziness,shortness of breath,arms tingling,slurred speech,swelling or redness in leg)    No chest discomfort, dizziness, shortness of breath, tingling in arm, slurred speech, swelling, or redness in leg.     Treatment  Target INR: 2.0-3.0 INR: 1.9  Date: 02/28/2010 Regimen In:  77.5mg /week INR reflects regimen in: 1.9  New  Tablet strength: : 5mg  Regimen Out:     Sunday: 2 & 1/2 Tablet     Monday: 2 Tablet     Tuesday: 2 & 1/2 Tablet  Wednesday: 2 & 1/2 Tablet     Thursday: 2 Tablet      Friday: 2 & 1/2 Tablet     Saturday: 2 & 1/2 Tablet Total Weekly: 82.5mg /week mg  Next INR Due: 03/21/2010 Adjusted by: Barbera Setters. Alexandria Lodge III PharmD CACP   Return to anticoagulation clinic:  03/21/2010 Time of next visit: 1615    Allergies: No Known Drug Allergies

## 2010-05-24 NOTE — Assessment & Plan Note (Signed)
Summary: ACUTE/TOBBIA/1 WEEK F/U/FOR CP/CH   Vital Signs:  Patient profile:   35 year old female Height:      61 inches Weight:      128.8 pounds BMI:     24.42 Temp:     97.7 degrees F oral Pulse rate:   62 / minute BP sitting:   110 / 71  Vitals Entered ByFilomena Jungling NT II (Sep 13, 2009 9:41 AM) CC: follow-up visit Is Patient Diabetic? No Pain Assessment Patient in pain? no      Nutritional Status BMI of 19 -24 = normal  Have you ever been in a relationship where you felt threatened, hurt or afraid?No   Does patient need assistance? Functional Status Self care Ambulation Normal   CC:  follow-up visit.  History of Present Illness: 34yof with pmh outlined in this chart here for follow up appointment.  Pmh is remarkable for large DVT following c-section in 2/11.  She has an IVC and has been on coumadin.  She had complaint last week of shortness of breath.  CTA chest was negative for PE.  Today she reports that she is doing well with no shortness of breath, chest pain or other symptoms.  She has continued pain in the left leg with a little swelling at times.  Preventive Screening-Counseling & Management  Alcohol-Tobacco     Smoking Status: never  Current Medications (verified): 1)  Warfarin Sodium 5 Mg Tabs (Warfarin Sodium) .... 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   Lungs:  normal respiratory effort and normal breath sounds.   Heart:  normal rate, regular rhythm, and no murmur.   Pulses:  2+ Extremities:  no edema Neurologic:  alert & oriented X3, cranial nerves II-XII intact, and gait normal.   Psych:  Oriented X3, memory intact for recent and remote, normally interactive, good eye contact, and not anxious appearing.     Impression & Recommendations:  Problem  # 1:  DEEP VENOUS THROMBOPHLEBITIS (ICD-453.40) She has a history of bilateral large DVT's following c-section in 2/11.  She is on coumadin and follows with Dr. Alexandria Lodge.  She also has a IVC filter in place.  Last week had shortness of breath, CTA negative for PE.  Today she has no symptoms orhter than continued pain in the left leg (DVT's were bilateral).  We discussed this.  I let her know that this could continue for years.  The best thing she can do is to prevent the leg from getting swollen by staying active, elevating the leg when resting and using compression stockings if needed.  There is no significant swelling today.  Complete Medication List: 1)  Warfarin Sodium 5 Mg Tabs (Warfarin sodium) .... Tablet strength: 5mg  take as directed  Patient Instructions: 1)  Please schedule a follow-up appointment in 3 months.   Prevention & Chronic Care Immunizations   Influenza vaccine: Not documented    Tetanus booster: Not documented    Pneumococcal vaccine: Not documented  Other Screening   Pap smear: Not documented   Smoking status: never  (09/13/2009)  Lipids   Total Cholesterol: Not documented   LDL: Not documented   LDL Direct: Not documented   HDL: Not documented   Triglycerides: Not documented

## 2010-05-24 NOTE — Assessment & Plan Note (Signed)
Summary: COU/CH  Anticoagulant Therapy Managed by: Barbera Setters. Janie Morning  PharmD CACP [facility] Attending: Coralee Pesa MD, Levada Schilling Indication 1: Deep vein thrombus Indication 2: Encounter for therapeutic drug monitoring  V58.83 Start date: 07/13/2009 Duration: 1 year  Patient Assessment Reviewed by: Chancy Milroy PharmD  September 27, 2009 Medication review: verified warfarin dosage & schedule,verified previous prescription medications, verified doses & any changes, verified new medications, reviewed OTC medications, reviewed OTC health products-vitamins supplements etc Complications: none Dietary changes: none   Health status changes: none   Lifestyle changes: none   Recent/future hospitalizations: none   Recent/future procedures: none   Recent/future dental: none Patient Assessment Part 2:  Have you MISSED ANY DOSES or CHANGED TABLETS?  No missed Warfarin doses or changed tablets.  Have you had any BRUISING or BLEEDING ( nose or gum bleeds,blood in urine or stool)?  No reported bruising or bleeding in nose, gums, urine, stool.  Have you STARTED or STOPPED any MEDICATIONS, including OTC meds,herbals or supplements?  No other medications or herbal supplements were started or stopped.  Have you CHANGED your DIET, especially green vegetables,or ALCOHOL intake?  No changes in diet or alcohol intake.  Have you had any ILLNESSES or HOSPITALIZATIONS?  No reported illnesses or hospitalizations  Have you had any signs of CLOTTING?(chest discomfort,dizziness,shortness of breath,arms tingling,slurred speech,swelling or redness in leg)    No chest discomfort, dizziness, shortness of breath, tingling in arm, slurred speech, swelling, or redness in leg.     Treatment  Target INR: 2.0-3.0 INR: 4.2  Date: 09/27/2009 Regimen In:  75.0mg /week INR reflects regimen in: 4.2  New  Tablet strength: : 5mg  Regimen Out:     Sunday: 2 Tablet     Monday: 2 Tablet     Tuesday: 2 Tablet     Wednesday: 2  Tablet     Thursday: 2 Tablet      Friday: 2 Tablet     Saturday: 2 Tablet Total Weekly: 70.0mg /week mg  Next INR Due: 10/11/2009 Adjusted by: Barbera Setters. Alexandria Lodge III PharmD CACP   Return to anticoagulation clinic:  10/11/2009 Time of next visit: 1030    Allergies: No Known Drug Allergies

## 2010-05-24 NOTE — Assessment & Plan Note (Signed)
Summary: EST-CK.FU.MEDS/CFB   Vital Signs:  Patient profile:   35 year old female Height:      61 inches (154.94 cm) Weight:      122.1 pounds (58.55 kg) BMI:     24.42 Temp:     98.8 degrees F (37.11 degrees C) oral Pulse rate:   83 / minute BP sitting:   110 / 64  (left arm) Cuff size:   regular  Vitals Entered By: Theotis Barrio NT II (January 31, 2010 4:04 PM) CC: REFUSE THE FLU SHOT /  FELT "FLUTTERING" ABOUT A WEEK AGO / TO [facility] Is Patient Diabetic? No Pain Assessment Patient in pain? no      Nutritional Status BMI of 19 -24 = normal  Have you ever been in a relationship where you felt threatened, hurt or afraid?No   Does patient need assistance? Functional Status Self care Ambulation Normal   CC:  REFUSE THE FLU SHOT /  FELT "FLUTTERING" ABOUT A WEEK AGO / TO [facility].  History of Present Illness: 34yof with pmh outlined in this chart here for follow up appointment.  Pmh is remarkable for large DVT following c-section in 2/11.  She has an IVC and has been on coumadin.  Today she reports that she is doing well with no shortness of breath, chest pain or other symptoms. she did say that she had some palpitation like symptoms over the past several weeks, but she has been drinking 3-4 cups of coffee daily, she refused to have EKG or TSH done today.   Preventive Screening-Counseling & Management  Alcohol-Tobacco     Smoking Status: never  Caffeine-Diet-Exercise     Does Patient Exercise: yes     Type of exercise: WALKING     Times/week: 6  Current Medications (verified): 1)  Warfarin Sodium 5 Mg Tabs (Warfarin Sodium) .... Tablet Strength: 5mg  Take As Directed  Allergies (verified): No Known Drug Allergies  Social History: Does Patient Exercise:  yes  Review of Systems       as per hPI  Physical Exam  General:  alert and well-developed.   Lungs:  normal respiratory effort and normal breath sounds.   Heart:  normal rate, regular rhythm, and no  murmur.   Abdomen:  soft, non-tender, normal bowel sounds, no distention, no guarding, no rebound tenderness, no hepatomegaly, and no splenomegaly.    CS scar present.  Msk:  no joint swelling, no joint warmth, and no redness over joints.    Extremities:  no edema Neurologic:  alert & oriented X3, cranial nerves II-XII intact, and gait normal.     Impression & Recommendations:  Problem # 1:  DEEP VENOUS THROMBOPHLEBITIS (ICD-453.40) Assessment Unchanged on coumadin since 05/2008, plan to d/c on 05/2009, etiology thought to be 2/2 to delivery.  has no complaints. will see Dr. Alexandria Lodge today for INR check.   Problem # 2:  PALPITATIONS (ICD-785.1) Assessment: New on and off over the past several weeks, now asymptomatic, pt says that she had similar symptoms in the hospital, EKG obtained was WNL. now she refuses further EKG or TSH. pt also states that she has been drinking alot of coffee 3-4 cups daily, i told her to reduce coffee and if symtptoms persist, she should call us.   Complete Medication List: 1)  Warfarin Sodium 5 Mg Tabs (Warfarin sodium) .... Tablet strength: 5mg  take as directed  Patient Instructions: 1)  Please schedule a follow-up appointment in 3 months.   Prevention & Chronic  Care Immunizations   Influenza vaccine: Not documented    Tetanus booster: Not documented    Pneumococcal vaccine: Not documented  Other Screening   Pap smear: Not documented   Smoking status: never  (01/31/2010)  Lipids   Total Cholesterol: Not documented   LDL: Not documented   LDL Direct: Not documented   HDL: Not documented   Triglycerides: Not documented

## 2010-05-24 NOTE — Assessment & Plan Note (Signed)
Summary: 945 APPT/CH  Anticoagulant Therapy Managed by: Barbera Setters. Amy Cole  PharmD CACP [facility] Attending: Coralee Pesa MD, Levada Schilling Indication 1: Deep vein thrombus Indication 2: Encounter for therapeutic drug monitoring  V58.83 Start date: 07/13/2009 Duration: 1 year  Patient Assessment Reviewed by: Chancy Milroy PharmD  Aug 23, 2009 Medication review: verified warfarin dosage & schedule,verified previous prescription medications, verified doses & any changes, verified new medications, reviewed OTC medications, reviewed OTC health products-vitamins supplements etc Complications: none Dietary changes: none   Health status changes: none   Lifestyle changes: none   Recent/future hospitalizations: none   Recent/future procedures: none   Recent/future dental: none Patient Assessment Part 2:  Have you MISSED ANY DOSES or CHANGED TABLETS?  No missed Warfarin doses or changed tablets.  Have you had any BRUISING or BLEEDING ( nose or gum bleeds,blood in urine or stool)?  No reported bruising or bleeding in nose, gums, urine, stool.  Have you STARTED or STOPPED any MEDICATIONS, including OTC meds,herbals or supplements?  No other medications or herbal supplements were started or stopped.  Have you CHANGED your DIET, especially green vegetables,or ALCOHOL intake?  No changes in diet or alcohol intake.  Have you had any ILLNESSES or HOSPITALIZATIONS?  No reported illnesses or hospitalizations  Have you had any signs of CLOTTING?(chest discomfort,dizziness,shortness of breath,arms tingling,slurred speech,swelling or redness in leg)    No chest discomfort, dizziness, shortness of breath, tingling in arm, slurred speech, swelling, or redness in leg.     Treatment  Target INR: 2.0-3.0 INR: 1.9  Date: 08/23/2009 Regimen In:  65.0mg /week INR reflects regimen in: 1.9  New  Tablet strength: : 5mg  Regimen Out:     Sunday: 2 Tablet     Monday: 2 & 1/2 Tablet     Tuesday: 2 Tablet     Wednesday:  2 & 1/2 Tablet     Thursday: 2 Tablet      Friday: 2 & 1/2 Tablet     Saturday: 2 Tablet Total Weekly: 77.5mg /week mg  Next INR Due: 08/30/2009 Adjusted by: Barbera Setters. Alexandria Lodge III PharmD CACP   Return to anticoagulation clinic:  08/30/2009 Time of next visit: 1030    Allergies: No Known Drug Allergies

## 2010-05-26 NOTE — Assessment & Plan Note (Signed)
Summary: COU/CH  Anticoagulant Therapy Managed by: Barbera Setters. Janie Morning  PharmD CACP [facility] Attending: Lowella Bandy MD Indication 1: Deep vein thrombus Indication 2: Encounter for therapeutic drug monitoring  V58.83 Start date: 07/13/2009 Duration: 1 year  Patient Assessment Reviewed by: Chancy Milroy PharmD  April 25, 2010 Medication review: verified warfarin dosage & schedule,verified previous prescription medications, verified doses & any changes, verified new medications, reviewed OTC medications, reviewed OTC health products-vitamins supplements etc Complications: none Dietary changes: none   Health status changes: none   Lifestyle changes: none   Recent/future hospitalizations: none   Recent/future procedures: none   Recent/future dental: none Patient Assessment Part 2:  Have you MISSED ANY DOSES or CHANGED TABLETS?  No missed Warfarin doses or changed tablets.  Have you had any BRUISING or BLEEDING ( nose or gum bleeds,blood in urine or stool)?  No reported bruising or bleeding in nose, gums, urine, stool.  Have you STARTED or STOPPED any MEDICATIONS, including OTC meds,herbals or supplements?  No other medications or herbal supplements were started or stopped.  Have you CHANGED your DIET, especially green vegetables,or ALCOHOL intake?  No changes in diet or alcohol intake.  Have you had any ILLNESSES or HOSPITALIZATIONS?  No reported illnesses or hospitalizations  Have you had any signs of CLOTTING?(chest discomfort,dizziness,shortness of breath,arms tingling,slurred speech,swelling or redness in leg)    No chest discomfort, dizziness, shortness of breath, tingling in arm, slurred speech, swelling, or redness in leg.     Treatment  Target INR: 2.0-3.0 INR: 2.6  Date: 04/25/2010 Regimen In:  87.5mg /week INR reflects regimen in: 2.6  New  Tablet strength: : 5mg  Regimen Out:     Sunday: 2 & 1/2 Tablet     Monday: 2 & 1/2 Tablet     Tuesday: 2 & 1/2 Tablet  Wednesday: 2 & 1/2 Tablet     Thursday: 2 & 1/2 Tablet      Friday: 2 & 1/2 Tablet     Saturday: 2 & 1/2 Tablet Total Weekly: 87.5mg /week mg  Next INR Due: 05/23/2010 Adjusted by: Barbera Setters. Alexandria Lodge III PharmD CACP   Return to anticoagulation clinic:  05/23/2010 Time of next visit: 1430    Allergies: No Known Drug Allergies

## 2010-06-01 NOTE — Assessment & Plan Note (Signed)
Summary: COU/CH  Anticoagulant Therapy Managed by: Barbera Setters. Amy Cole  PharmD CACP [facility] Attending: Lowella Bandy MD Indication 1: Deep vein thrombus Indication 2: Encounter for therapeutic drug monitoring  V58.83 Start date: 07/13/2009 Duration: 1 year  Patient Assessment Reviewed by: Chancy Milroy PharmD  May 24, 2010 Medication review: verified warfarin dosage & schedule,verified previous prescription medications, verified doses & any changes, verified new medications, reviewed OTC medications, reviewed OTC health products-vitamins supplements etc Complications: none Dietary changes: none   Health status changes: none   Lifestyle changes: none   Recent/future hospitalizations: none   Recent/future procedures: none   Recent/future dental: none Patient Assessment Part 2:  Have you MISSED ANY DOSES or CHANGED TABLETS?  No missed Warfarin doses or changed tablets.  Have you had any BRUISING or BLEEDING ( nose or gum bleeds,blood in urine or stool)?  No reported bruising or bleeding in nose, gums, urine, stool.  Have you STARTED or STOPPED any MEDICATIONS, including OTC meds,herbals or supplements?  No other medications or herbal supplements were started or stopped.  Have you CHANGED your DIET, especially green vegetables,or ALCOHOL intake?  No changes in diet or alcohol intake.  Have you had any ILLNESSES or HOSPITALIZATIONS?  No reported illnesses or hospitalizations  Have you had any signs of CLOTTING?(chest discomfort,dizziness,shortness of breath,arms tingling,slurred speech,swelling or redness in leg)    No chest discomfort, dizziness, shortness of breath, tingling in arm, slurred speech, swelling, or redness in leg.     Treatment  Target INR: 2.0-3.0 INR: 3.0  Date: 01/31/2010 Regimen In:  77.5mg /week INR reflects regimen in: 3.0  New  Tablet strength: : 5mg  Regimen Out:     Sunday: 2 Tablet     Monday: 2 & 1/2 Tablet     Tuesday: 2 Tablet     Wednesday: 2  Tablet     Thursday: 2 & 1/2 Tablet      Friday: 2 Tablet     Saturday: 2 Tablet Total Weekly: 75.0mg /week mg  Next INR Due: 02/28/2010 Adjusted by: Barbera Setters. Amy Cole PharmD CACP   Return to anticoagulation clinic:  02/28/2010 Time of next visit: 1500    Allergies: No Known Drug Allergies

## 2010-06-01 NOTE — Assessment & Plan Note (Signed)
Summary: COU/CH  Anticoagulant Therapy Managed by: Barbera Setters. Alexandria Lodge III  PharmD CACP Indication 1: Deep vein thrombus Indication 2: Encounter for therapeutic drug monitoring  V58.83 Start date: 07/13/2009 Duration: 1 year  Patient Assessment Reviewed by: Chancy Milroy PharmD  May 23, 2010 Medication review: verified warfarin dosage & schedule,verified previous prescription medications, verified doses & any changes, verified new medications, reviewed OTC medications, reviewed OTC health products-vitamins supplements etc Complications: none Dietary changes: none   Health status changes: none   Lifestyle changes: none   Recent/future hospitalizations: none   Recent/future procedures: none   Recent/future dental: none Patient Assessment Part 2:  Have you MISSED ANY DOSES or CHANGED TABLETS?  No missed Warfarin doses or changed tablets.  Have you had any BRUISING or BLEEDING ( nose or gum bleeds,blood in urine or stool)?  No reported bruising or bleeding in nose, gums, urine, stool.  Have you STARTED or STOPPED any MEDICATIONS, including OTC meds,herbals or supplements?  No other medications or herbal supplements were started or stopped.  Have you CHANGED your DIET, especially green vegetables,or ALCOHOL intake?  No changes in diet or alcohol intake.  Have you had any ILLNESSES or HOSPITALIZATIONS?  No reported illnesses or hospitalizations  Have you had any signs of CLOTTING?(chest discomfort,dizziness,shortness of breath,arms tingling,slurred speech,swelling or redness in leg)    No chest discomfort, dizziness, shortness of breath, tingling in arm, slurred speech, swelling, or redness in leg.     Treatment  Target INR: 2.0-3.0 INR: 1.9  Date: 05/23/2010 Regimen In:  87.5mg /week INR reflects regimen in: 1.9  New  Tablet strength: : 5mg  Regimen Out:     Sunday: 2 & 1/2 Tablet     Monday: 3 Tablet     Tuesday: 2 & 1/2 Tablet     Wednesday: 3 Tablet     Thursday: 2 & 1/2  Tablet      Friday: 3 Tablet     Saturday: 2 & 1/2 Tablet Total Weekly: 95.0mg /week mg  Next INR Due: 06/20/2010 Adjusted by: Barbera Setters. Alexandria Lodge III PharmD CACP   Return to anticoagulation clinic:  06/20/2010 Time of next visit: 1445    Allergies: No Known Drug Allergies

## 2010-06-09 ENCOUNTER — Encounter: Payer: Self-pay | Admitting: Internal Medicine

## 2010-06-09 ENCOUNTER — Ambulatory Visit (INDEPENDENT_AMBULATORY_CARE_PROVIDER_SITE_OTHER): Payer: BC Managed Care – PPO | Admitting: Internal Medicine

## 2010-06-09 VITALS — BP 114/67 | HR 94 | Temp 97.3°F | Ht 61.0 in | Wt 124.8 lb

## 2010-06-09 DIAGNOSIS — Z7901 Long term (current) use of anticoagulants: Secondary | ICD-10-CM

## 2010-06-09 NOTE — Progress Notes (Signed)
  Subjective:    Patient ID: Amy Cole, female    DOB: 16-Sep-1975, 35 y.o.   MRN: [id number]  HPI  Patient is 35 year old female with  Past medical history outlined below presents to clinic  to discuss stopping Coumadin therapy. She has been on Coumadin since February 2004 13 when she developed DVT which happened 3 weeks after cesarean section. She denies previous histories of clots and was told at a time that is episode of DVT was secondary to C-section and immobility post op.  She tolerated Coumadin for one year well and was compliant with medication regimen. She has not developed additional clots since she's been on Coumadin. Currently she reports doing well, has no concerns, denies recent hospitalizations or sicknesses,  No episodes of chest pain, shortness of breath, no abdominal or urinary concerns, no fevers, chills, palpitations, no blood in urine or stool noted.  Review of Systems  Constitutional: Negative.   HENT: Negative.   Eyes: Negative.   Respiratory: Negative.   Cardiovascular: Negative.   Gastrointestinal: Negative.   Genitourinary: Negative.   Musculoskeletal: Negative.   Neurological: Negative.   Psychiatric/Behavioral: Negative.        Objective:   Physical Exam  Constitutional: She is oriented to person, place, and time. She appears well-developed and well-nourished. No distress.  HENT:  Head: Normocephalic and atraumatic.  Right Ear: External ear normal.  Left Ear: External ear normal.  Nose: Nose normal.  Mouth/Throat: Oropharynx is clear and moist. No oropharyngeal exudate.  Eyes: Conjunctivae and EOM are normal. Pupils are equal, round, and reactive to light. Right eye exhibits no discharge. Left eye exhibits no discharge. No scleral icterus.  Neck: Normal range of motion. Neck supple. No JVD present. No tracheal deviation present. No thyromegaly present.  Cardiovascular: Normal rate, regular rhythm, normal heart sounds and intact distal pulses.  Exam  reveals no gallop and no friction rub.   No murmur heard. Pulmonary/Chest: Effort normal and breath sounds normal. No respiratory distress. She has no wheezes. She has no rales. She exhibits no tenderness.  Abdominal: Soft. Bowel sounds are normal. She exhibits no distension and no mass. There is no tenderness. There is no rebound and no guarding.  Musculoskeletal: Normal range of motion. She exhibits no edema and no tenderness.  Lymphadenopathy:    She has no cervical adenopathy.  Neurological: She is alert and oriented to person, place, and time. She has normal reflexes. She displays normal reflexes. No cranial nerve deficit. She exhibits normal muscle tone. Coordination normal.  Skin: Skin is warm and dry. No rash noted. She is not diaphoretic. No erythema. No pallor.  Psychiatric: She has a normal mood and affect. Her behavior is normal. Judgment and thought content normal.          Assessment & Plan:

## 2010-06-09 NOTE — Assessment & Plan Note (Signed)
I have discussed this case with Dr. Coralee Pesa.  Since there was a provoking event that contributed to formation of clot we have agreed that stopping Coumadin after one year of therapy is reasonable. Patient was made aware of potential new clots forming and was made aware of that the symptoms she may experience in such case. She was advised if she develops any of those symptoms to go to emergency department or to call clinic. We will see patient in 3 months for regular follow up appointment to ensure patient is doing well off Coumadin.

## 2010-06-10 ENCOUNTER — Encounter: Payer: Self-pay | Admitting: Internal Medicine

## 2010-06-10 NOTE — Patient Instructions (Signed)
Please schedule follow up appointment in 3 months

## 2010-06-20 ENCOUNTER — Ambulatory Visit: Payer: Self-pay

## 2010-06-29 ENCOUNTER — Telehealth: Payer: Self-pay | Admitting: *Deleted

## 2010-06-29 NOTE — Telephone Encounter (Signed)
Pt called and states she was taken off coumadin on 2/23 after taking for a year because of phlebitis in legs and abd. ( after C section) Now in the mornings after getting out of [place] she notes pressure and  achy feeling in both legs, thigh area and lower  leg,  After walking around these feelings go away.  She is out strolling ow and pain free. Denies redness or swelling, pain.  She just wants to be sure there is nothing to worry about. Pt # [id number]

## 2010-06-29 NOTE — Telephone Encounter (Signed)
Must be seen

## 2010-06-29 NOTE — Telephone Encounter (Signed)
Will see tomorrow

## 2010-06-30 ENCOUNTER — Encounter: Payer: Self-pay | Admitting: Internal Medicine

## 2010-06-30 ENCOUNTER — Ambulatory Visit (INDEPENDENT_AMBULATORY_CARE_PROVIDER_SITE_OTHER): Payer: BC Managed Care – PPO | Admitting: Internal Medicine

## 2010-06-30 VITALS — BP 110/61 | HR 88 | Temp 99.3°F | Ht 61.0 in | Wt 124.7 lb

## 2010-06-30 DIAGNOSIS — R52 Pain, unspecified: Secondary | ICD-10-CM

## 2010-06-30 LAB — CBC
HCT: 39 % (ref 36.0–46.0)
Hemoglobin: 12.7 g/dL (ref 12.0–15.0)
MCH: 28.6 pg (ref 26.0–34.0)
MCV: 87.8 fL (ref 78.0–100.0)
Platelets: 257 10*3/uL (ref 150–400)
RBC: 4.44 MIL/uL (ref 3.87–5.11)

## 2010-06-30 LAB — COMPREHENSIVE METABOLIC PANEL
ALT: 10 U/L (ref 0–35)
AST: 11 U/L (ref 0–37)
BUN: 15 mg/dL (ref 6–23)
CO2: 23 mEq/L (ref 19–32)
Glucose, Bld: 87 mg/dL (ref 70–99)
Total Bilirubin: 0.7 mg/dL (ref 0.3–1.2)

## 2010-06-30 LAB — MAGNESIUM: Magnesium: 2.2 mg/dL (ref 1.5–2.5)

## 2010-06-30 LAB — TSH: TSH: 1.247 u[IU]/mL (ref 0.350–4.500)

## 2010-06-30 NOTE — Patient Instructions (Signed)
Make an appointment as needed. You will be called with any abnormalities in the tests scheduled or performed today.

## 2010-06-30 NOTE — Progress Notes (Signed)
  Subjective:    Patient ID: Amy Cole, female    DOB: 1976-04-01, 35 y.o.   MRN: [id number]  HPI  35 yr old woman with  Past Medical History  Diagnosis Date  . Uterine fibroid   . DVT (deep vein thrombosis) in pregnancy 06/2009    extensive iliofemoral DVT, left lower extremity; s/p percutaneous thrombectomy done by Dr. Bonnielee Haff (07/18/2009), started on Coumadin at that time   comes to the clinic complaining of body aches, of mild intensity, located biceps and thighs, mostly felt in the morning when she wakes up. Patient was discontinue off coumadin and reports that body has been feeling funny since stopping medication. Patient anxious about having another DVT. Patient denies lower extremity swelling, erythema, chest pain, shortness of breath, palpitations, diaphoresis, blurry vision, vision loss, focal weakness, slurred speech, recent surgery, >3 days bedrest or immobilization, or hemoptysis.  Review of Systems  All other systems reviewed and are negative.       Objective:   Physical Exam  Constitutional: She is oriented to person, place, and time. She appears well-developed and well-nourished. No distress.  HENT:  Head: Normocephalic and atraumatic.  Right Ear: External ear normal.  Left Ear: External ear normal.  Nose: Nose normal.  Mouth/Throat: Oropharynx is clear and moist. No oropharyngeal exudate.  Eyes: Conjunctivae and EOM are normal. Pupils are equal, round, and reactive to light. Right eye exhibits no discharge. Left eye exhibits no discharge. No scleral icterus.  Neck: Normal range of motion. Neck supple. No JVD present. No tracheal deviation present. No thyromegaly present.  Cardiovascular: Normal rate, regular rhythm, normal heart sounds and intact distal pulses.  Exam reveals no gallop and no friction rub.   No murmur heard. Pulmonary/Chest: Effort normal and breath sounds normal. No respiratory distress. She has no wheezes. She has no rales. She exhibits no  tenderness.  Abdominal: Soft. Bowel sounds are normal. She exhibits no distension and no mass. There is no tenderness. There is no rebound and no guarding.  Musculoskeletal: Normal range of motion. She exhibits no edema and no tenderness.  Lymphadenopathy:    She has no cervical adenopathy.  Neurological: She is alert and oriented to person, place, and time. She has normal reflexes. No cranial nerve deficit. She exhibits normal muscle tone. Coordination normal.  Skin: Skin is warm and dry. No rash noted. She is not diaphoretic. No erythema. No pallor.  Psychiatric: She has a normal mood and affect. Her behavior is normal. Judgment and thought content normal.          Assessment & Plan:

## 2010-07-04 ENCOUNTER — Encounter: Payer: Self-pay | Admitting: Internal Medicine

## 2010-07-04 NOTE — Assessment & Plan Note (Signed)
Likely musculoskeletal pain. No evidence of erythema, swelling, erythema of lower extremities to suggest DVT. Will check labs and reassess.

## 2010-07-13 LAB — CBC
Hemoglobin: 7.8 g/dL — ABNORMAL LOW (ref 12.0–15.0)
MCV: 85.8 fL (ref 78.0–100.0)
Platelets: 104 10*3/uL — ABNORMAL LOW (ref 150–400)
RDW: 16.8 % — ABNORMAL HIGH (ref 11.5–15.5)

## 2010-07-13 LAB — PROTIME-INR: Prothrombin Time: 29.1 seconds — ABNORMAL HIGH (ref 11.6–15.2)

## 2010-07-13 LAB — COMPREHENSIVE METABOLIC PANEL
ALT: 28 U/L (ref 0–35)
AST: 30 U/L (ref 0–37)
CO2: 26 mEq/L (ref 19–32)
Chloride: 102 mEq/L (ref 96–112)
Potassium: 3.7 mEq/L (ref 3.5–5.1)
Total Bilirubin: 0.3 mg/dL (ref 0.3–1.2)
Total Protein: 6.6 g/dL (ref 6.0–8.3)

## 2010-07-14 LAB — CBC
HCT: 27.4 % — ABNORMAL LOW (ref 36.0–46.0)
HCT: 33.4 % — ABNORMAL LOW (ref 36.0–46.0)
MCHC: 33.7 g/dL (ref 30.0–36.0)
MCHC: 33.7 g/dL (ref 30.0–36.0)
MCV: 91.5 fL (ref 78.0–100.0)
MCV: 92.7 fL (ref 78.0–100.0)
Platelets: 151 10*3/uL (ref 150–400)
Platelets: 179 10*3/uL (ref 150–400)
RBC: 3.66 MIL/uL — ABNORMAL LOW (ref 3.87–5.11)
RDW: 13.7 % (ref 11.5–15.5)
RDW: 13.8 % (ref 11.5–15.5)

## 2010-07-14 LAB — CCBB MATERNAL DONOR DRAW

## 2010-07-14 LAB — RPR: RPR Ser Ql: NONREACTIVE

## 2010-07-18 LAB — BASIC METABOLIC PANEL
BUN: 12 mg/dL (ref 6–23)
BUN: 3 mg/dL — ABNORMAL LOW (ref 6–23)
BUN: 4 mg/dL — ABNORMAL LOW (ref 6–23)
BUN: 5 mg/dL — ABNORMAL LOW (ref 6–23)
BUN: 6 mg/dL (ref 6–23)
CO2: 19 mEq/L (ref 19–32)
CO2: 25 mEq/L (ref 19–32)
CO2: 26 mEq/L (ref 19–32)
CO2: 26 mEq/L (ref 19–32)
CO2: 26 mEq/L (ref 19–32)
CO2: 27 mEq/L (ref 19–32)
Calcium: 8.4 mg/dL (ref 8.4–10.5)
Calcium: 8.4 mg/dL (ref 8.4–10.5)
Chloride: 102 mEq/L (ref 96–112)
Chloride: 103 mEq/L (ref 96–112)
Chloride: 104 mEq/L (ref 96–112)
Chloride: 106 mEq/L (ref 96–112)
Chloride: 107 mEq/L (ref 96–112)
Creatinine, Ser: 0.58 mg/dL (ref 0.4–1.2)
Creatinine, Ser: 0.58 mg/dL (ref 0.4–1.2)
Creatinine, Ser: 0.58 mg/dL (ref 0.4–1.2)
GFR calc Af Amer: >60 mL/min (ref >60)
GFR calc non Af Amer: >60 mL/min (ref >60)
GFR calc non Af Amer: >60 mL/min (ref >60)
GFR calc non Af Amer: >60 mL/min (ref >60)
Glucose, Bld: 114 mg/dL — ABNORMAL HIGH (ref 70–99)
Glucose, Bld: 140 mg/dL — ABNORMAL HIGH (ref 70–99)
Glucose, Bld: 93 mg/dL (ref 70–99)
Glucose, Bld: 94 mg/dL (ref 70–99)
Potassium: 2.7 mEq/L — CL (ref 3.5–5.1)
Potassium: 3.3 mEq/L — ABNORMAL LOW (ref 3.5–5.1)
Potassium: 3.6 mEq/L (ref 3.5–5.1)
Potassium: 3.7 mEq/L (ref 3.5–5.1)
Potassium: 4 mEq/L (ref 3.5–5.1)
Sodium: 136 mEq/L (ref 135–145)
Sodium: 138 mEq/L (ref 135–145)
Sodium: 138 mEq/L (ref 135–145)
Sodium: 139 mEq/L (ref 135–145)
Sodium: 144 mEq/L (ref 135–145)

## 2010-07-18 LAB — CBC
HCT: 25.5 % — ABNORMAL LOW (ref 36.0–46.0)
HCT: 27.2 % — ABNORMAL LOW (ref 36.0–46.0)
HCT: 21.5 % — ABNORMAL LOW (ref 36.0–46.0)
HCT: 21.9 % — ABNORMAL LOW (ref 36.0–46.0)
HCT: 22.7 % — ABNORMAL LOW (ref 36.0–46.0)
HCT: 22.9 % — ABNORMAL LOW (ref 36.0–46.0)
HCT: 23.9 % — ABNORMAL LOW (ref 36.0–46.0)
HCT: 25.6 % — ABNORMAL LOW (ref 36.0–46.0)
HCT: 25.9 % — ABNORMAL LOW (ref 36.0–46.0)
Hemoglobin: 8.5 g/dL — ABNORMAL LOW (ref 12.0–15.0)
Hemoglobin: 7.2 g/dL — ABNORMAL LOW (ref 12.0–15.0)
Hemoglobin: 7.4 g/dL — ABNORMAL LOW (ref 12.0–15.0)
Hemoglobin: 8.1 g/dL — ABNORMAL LOW (ref 12.0–15.0)
Hemoglobin: 8.9 g/dL — ABNORMAL LOW (ref 12.0–15.0)
MCHC: 23.8 g/dL — ABNORMAL LOW (ref 30.0–36.0)
MCHC: 33 g/dL (ref 30.0–36.0)
MCHC: 33.4 g/dL (ref 30.0–36.0)
MCHC: 33.8 g/dL (ref 30.0–36.0)
MCHC: 34.2 g/dL (ref 30.0–36.0)
MCV: 87.7 fL (ref 78.0–100.0)
MCV: 85.9 fL (ref 78.0–100.0)
MCV: 86 fL (ref 78.0–100.0)
MCV: 86.2 fL (ref 78.0–100.0)
MCV: 86.5 fL (ref 78.0–100.0)
MCV: 86.6 fL (ref 78.0–100.0)
MCV: 86.7 fL (ref 78.0–100.0)
MCV: 86.8 fL (ref 78.0–100.0)
MCV: 87.1 fL (ref 78.0–100.0)
MCV: 87.2 fL (ref 78.0–100.0)
Platelets: 287 10*3/uL (ref 150–400)
Platelets: 418 10*3/uL — ABNORMAL HIGH (ref 150–400)
Platelets: 440 10*3/uL — ABNORMAL HIGH (ref 150–400)
Platelets: 495 10*3/uL — ABNORMAL HIGH (ref 150–400)
Platelets: 550 10*3/uL — ABNORMAL HIGH (ref 150–400)
Platelets: 572 10*3/uL — ABNORMAL HIGH (ref 150–400)
RBC: 2.9 MIL/uL — ABNORMAL LOW (ref 3.87–5.11)
RBC: 3.1 MIL/uL — ABNORMAL LOW (ref 3.87–5.11)
RBC: 2.53 MIL/uL — ABNORMAL LOW (ref 3.87–5.11)
RBC: 2.63 MIL/uL — ABNORMAL LOW (ref 3.87–5.11)
RBC: 2.78 MIL/uL — ABNORMAL LOW (ref 3.87–5.11)
RBC: 2.87 MIL/uL — ABNORMAL LOW (ref 3.87–5.11)
RBC: 3.6 MIL/uL — ABNORMAL LOW (ref 3.87–5.11)
RDW: 14.9 % (ref 11.5–15.5)
RDW: 15.2 % (ref 11.5–15.5)
RDW: 15.4 % (ref 11.5–15.5)
RDW: 15.6 % — ABNORMAL HIGH (ref 11.5–15.5)
RDW: 15.6 % — ABNORMAL HIGH (ref 11.5–15.5)
RDW: 15.6 % — ABNORMAL HIGH (ref 11.5–15.5)
RDW: 16.5 % — ABNORMAL HIGH (ref 11.5–15.5)
WBC: 11.9 10*3/uL — ABNORMAL HIGH (ref 4.0–10.5)
WBC: 13.3 10*3/uL — ABNORMAL HIGH (ref 4.0–10.5)
WBC: 13.9 10*3/uL — ABNORMAL HIGH (ref 4.0–10.5)
WBC: 14 10*3/uL — ABNORMAL HIGH (ref 4.0–10.5)
WBC: 17.6 10*3/uL — ABNORMAL HIGH (ref 4.0–10.5)
WBC: 7.9 10*3/uL (ref 4.0–10.5)
WBC: 9.5 10*3/uL (ref 4.0–10.5)

## 2010-07-18 LAB — DIFFERENTIAL
Basophils Absolute: 0 10*3/uL (ref 0.0–0.1)
Basophils Absolute: 0 10*3/uL (ref 0.0–0.1)
Basophils Absolute: 0 10*3/uL (ref 0.0–0.1)
Basophils Relative: 0 % (ref 0–1)
Basophils Relative: 0 % (ref 0–1)
Eosinophils Absolute: 0.2 10*3/uL (ref 0.0–0.7)
Eosinophils Relative: 0 % (ref 0–5)
Eosinophils Relative: 0 % (ref 0–5)
Eosinophils Relative: 2 % (ref 0–5)
Lymphocytes Relative: 7 % — ABNORMAL LOW (ref 12–46)
Lymphocytes Relative: 8 % — ABNORMAL LOW (ref 12–46)
Monocytes Absolute: 0.8 10*3/uL (ref 0.1–1.0)
Monocytes Absolute: 1 10*3/uL (ref 0.1–1.0)
Neutro Abs: 11.6 10*3/uL — ABNORMAL HIGH (ref 1.7–7.7)

## 2010-07-18 LAB — PROTIME-INR
INR: 1.19 (ref 0.00–1.49)
INR: 1.22 (ref 0.00–1.49)
INR: 1.24 (ref 0.00–1.49)
INR: 1.83 — ABNORMAL HIGH (ref 0.00–1.49)
Prothrombin Time: 15 seconds (ref 11.6–15.2)
Prothrombin Time: 17.2 seconds — ABNORMAL HIGH (ref 11.6–15.2)
Prothrombin Time: 21 seconds — ABNORMAL HIGH (ref 11.6–15.2)

## 2010-07-18 LAB — CULTURE, BLOOD (ROUTINE X 2): Culture: NO GROWTH

## 2010-07-18 LAB — URINALYSIS, ROUTINE W REFLEX MICROSCOPIC
Bilirubin Urine: NEGATIVE
Hgb urine dipstick: NEGATIVE
Ketones, ur: 15 mg/dL — AB
Ketones, ur: NEGATIVE mg/dL
Leukocytes, UA: NEGATIVE
Nitrite: NEGATIVE
Nitrite: NEGATIVE
Specific Gravity, Urine: 1.01 (ref 1.005–1.030)
Urobilinogen, UA: 1 mg/dL (ref 0.0–1.0)
Urobilinogen, UA: 1 mg/dL (ref 0.0–1.0)
pH: 6 (ref 5.0–8.0)

## 2010-07-18 LAB — FIBRINOGEN
Fibrinogen: 189 mg/dL — ABNORMAL LOW (ref 204–475)
Fibrinogen: 197 mg/dL — ABNORMAL LOW (ref 204–475)
Fibrinogen: >800 mg/dL — ABNORMAL HIGH (ref 204–475)
Fibrinogen: >800 mg/dL — ABNORMAL HIGH (ref 204–475)

## 2010-07-18 LAB — URINE CULTURE: Colony Count: >=100000

## 2010-07-18 LAB — COMPREHENSIVE METABOLIC PANEL
ALT: 25 U/L (ref 0–35)
AST: 9 U/L (ref 0–37)
AST: 32 U/L (ref 0–37)
Albumin: 2.2 g/dL — ABNORMAL LOW (ref 3.5–5.2)
Alkaline Phosphatase: 68 U/L (ref 39–117)
BUN: 9 mg/dL (ref 6–23)
CO2: 24 mEq/L (ref 19–32)
CO2: 25 mEq/L (ref 19–32)
Calcium: 8.3 mg/dL — ABNORMAL LOW (ref 8.4–10.5)
Chloride: 103 mEq/L (ref 96–112)
Chloride: 105 mEq/L (ref 96–112)
Creatinine, Ser: 0.51 mg/dL (ref 0.4–1.2)
GFR calc Af Amer: >60 mL/min (ref >60)
GFR calc non Af Amer: >60 mL/min (ref >60)
GFR calc non Af Amer: >60 mL/min (ref >60)
Potassium: 3.8 mEq/L (ref 3.5–5.1)
Sodium: 137 mEq/L (ref 135–145)
Total Bilirubin: 0.2 mg/dL — ABNORMAL LOW (ref 0.3–1.2)

## 2010-07-18 LAB — APTT
aPTT: 44 seconds — ABNORMAL HIGH (ref 24–37)
aPTT: 68 seconds — ABNORMAL HIGH (ref 24–37)

## 2010-07-18 LAB — URINE MICROSCOPIC-ADD ON

## 2010-07-18 LAB — HEPARIN LEVEL (UNFRACTIONATED)
Heparin Unfractionated: 0.66 IU/mL (ref 0.30–0.70)
Heparin Unfractionated: <0.1 IU/mL — ABNORMAL LOW (ref 0.30–0.70)

## 2010-07-18 LAB — LACTATE DEHYDROGENASE: LDH: 118 U/L (ref 94–250)

## 2010-07-18 LAB — HEPARIN INDUCED THROMBOCYTOPENIA PNL
Patient O.D.: 2.698
UFH Low Dose 0.5 IU/mL: 85 % Release

## 2010-07-27 LAB — CBC
Hemoglobin: 10.9 g/dL — ABNORMAL LOW (ref 12.0–15.0)
MCHC: 33.9 g/dL (ref 30.0–36.0)
Platelets: 231 10*3/uL (ref 150–400)
RDW: 13.3 % (ref 11.5–15.5)

## 2010-07-27 LAB — RH IMMUNE GLOBULIN WORKUP (NOT WOMEN'S HOSP): ABO/RH(D): B NEG

## 2010-07-27 LAB — RPR: RPR Ser Ql: NONREACTIVE

## 2010-07-27 LAB — GLUCOSE TOLERANCE, 1 HOUR: Glucose, 1 Hour GTT: 116 mg/dL (ref 70–140)

## 2010-11-23 IMAGING — CT CT ABD-PELV W/ CM
1 of 3 series · 12 of 32 positions shown, 18 images · IV contrast (OMNIPAQUE)
Comparison: None.

CLINICAL DATA: Pelvic pain and left lower extremity pain and
swelling.  Recently diagnosed left lower extremity deep venous
thrombosis.  Cesarean section delivery 06/07/2009

CT ABDOMEN AND PELVIS WITH CONTRAST
TECHNIQUE: Multidetector CT imaging of the abdomen and pelvis was
performed following the standard protocol during bolus
administration of intravenous contrast.
Contrast: 100 ml Omniscan 300 IV contrast

[Series 2: routine abdomen/pelvis with · axial · 0.72mm/px · z∈[-492,-92]mm · 12 of 94 slices shown, 18 images]
[im 7/94  soft-tissue]
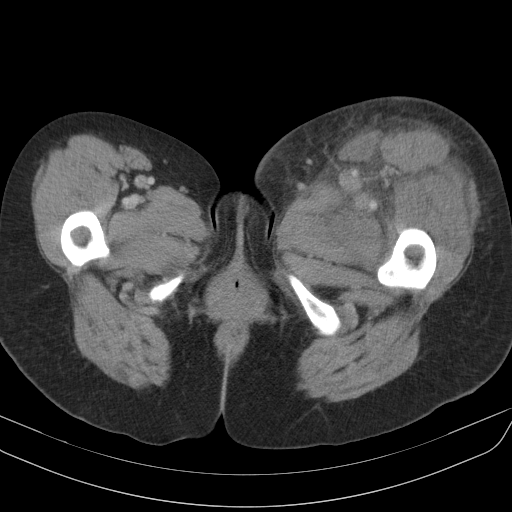
[im 7/94  bone]
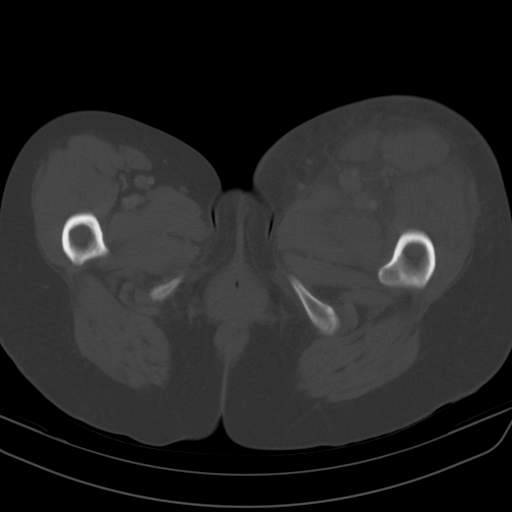
[im 14/94  soft-tissue]
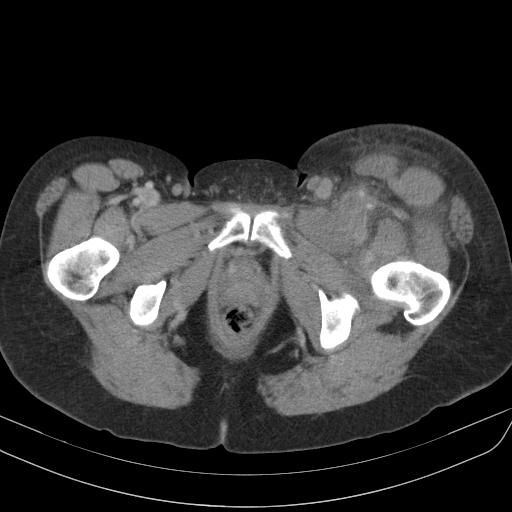
[im 20/94  soft-tissue]
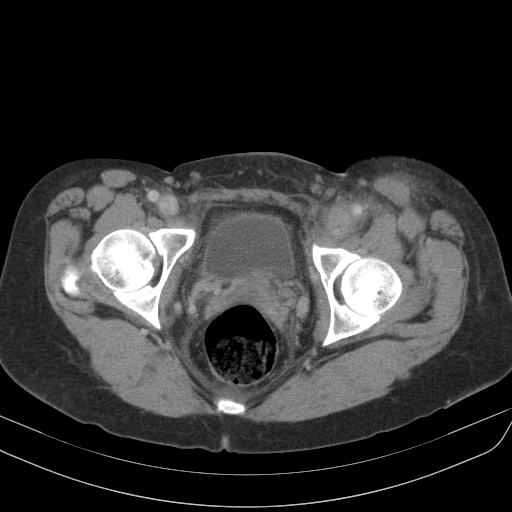
[im 27/94  soft-tissue]
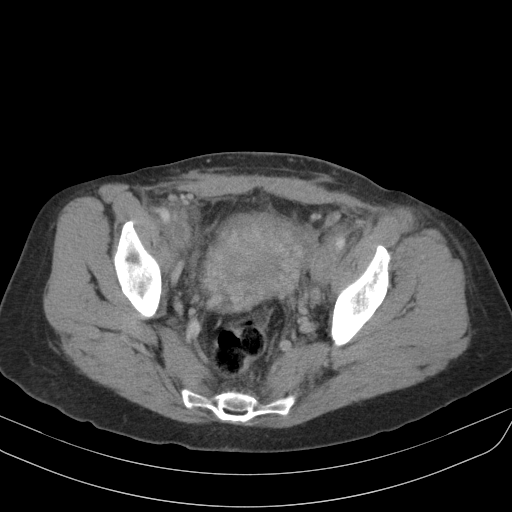
[im 34/94  soft-tissue]
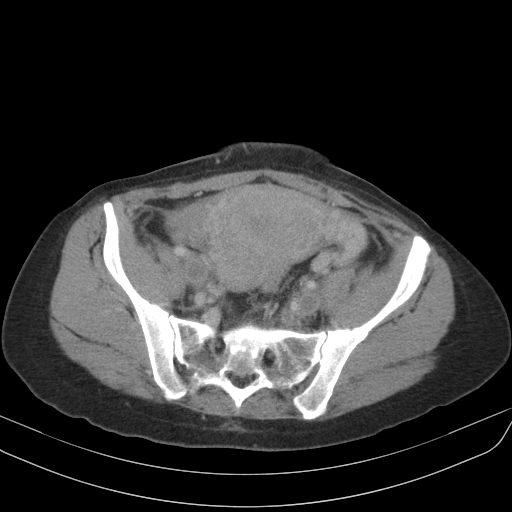
[im 40/94  soft-tissue]
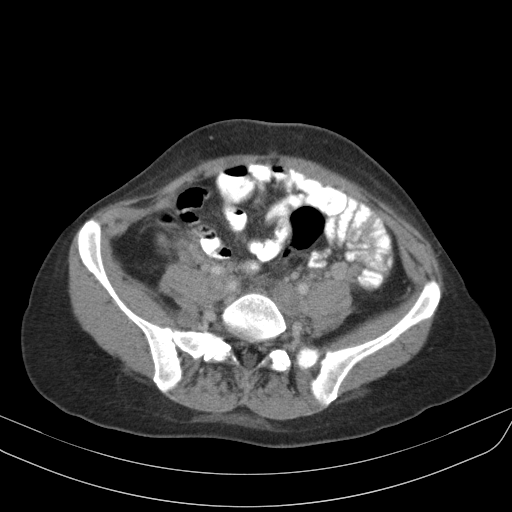
[im 54/94  soft-tissue]
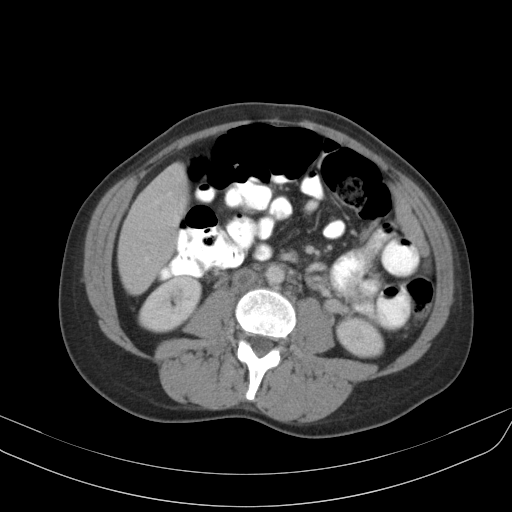
[im 60/94  soft-tissue]
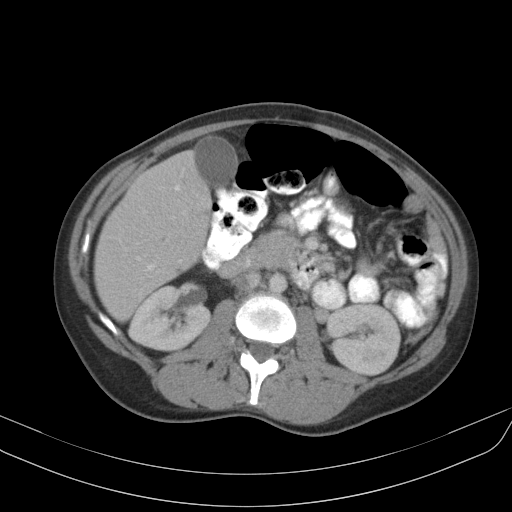
[im 67/94  soft-tissue]
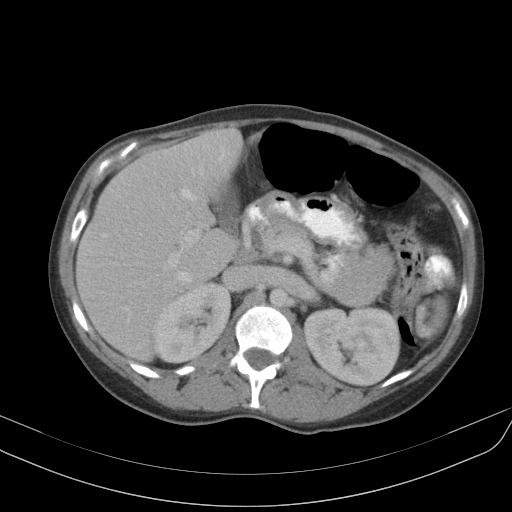
[im 67/94  lung]
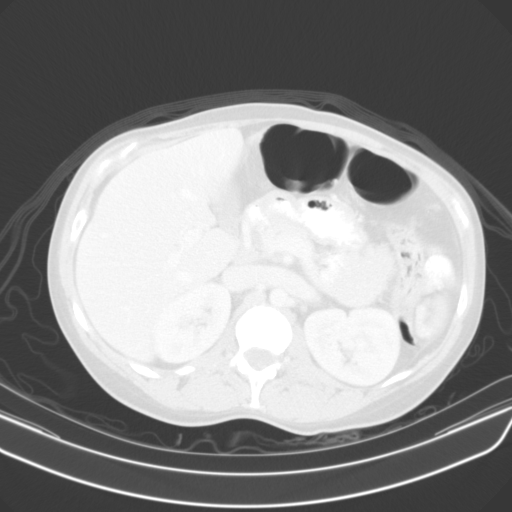
[im 67/94  bone]
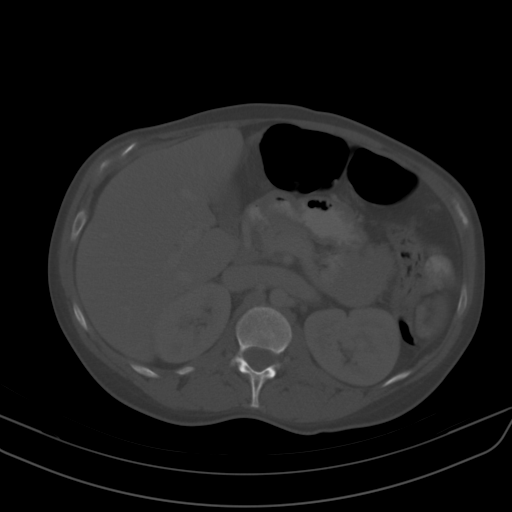
[im 74/94  soft-tissue]
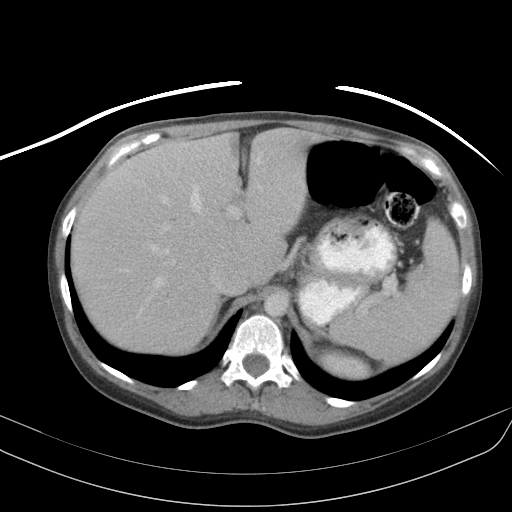
[im 74/94  lung]
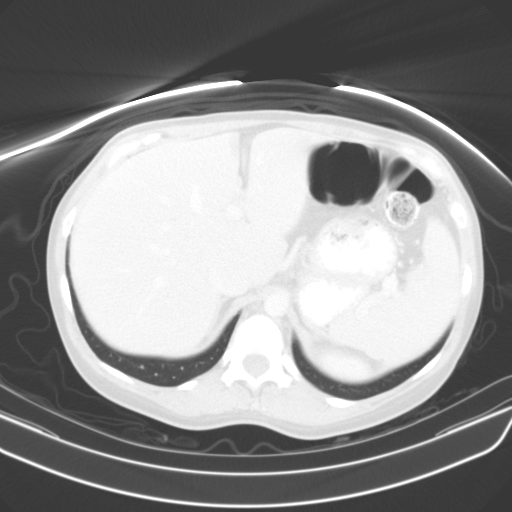
[im 80/94  soft-tissue]
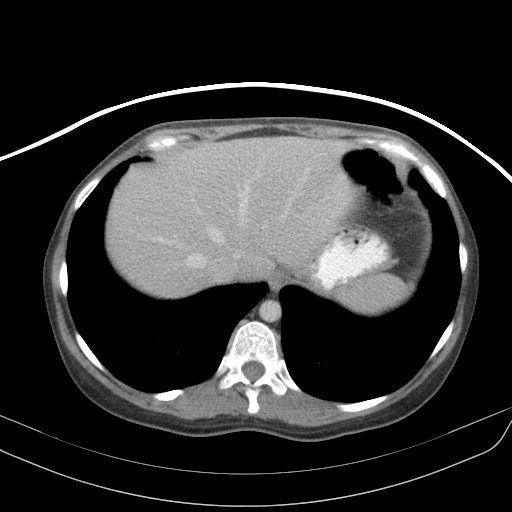
[im 80/94  lung]
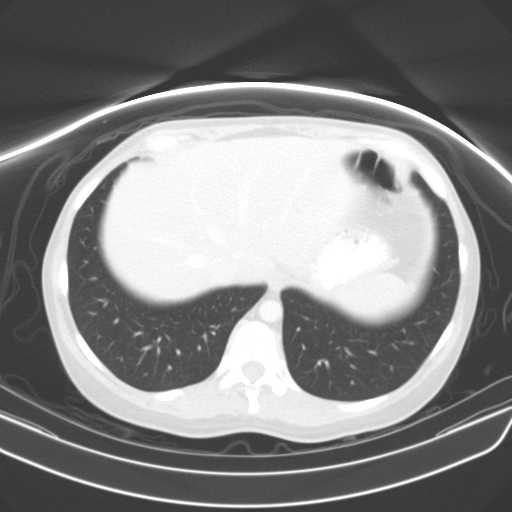
[im 87/94  soft-tissue]
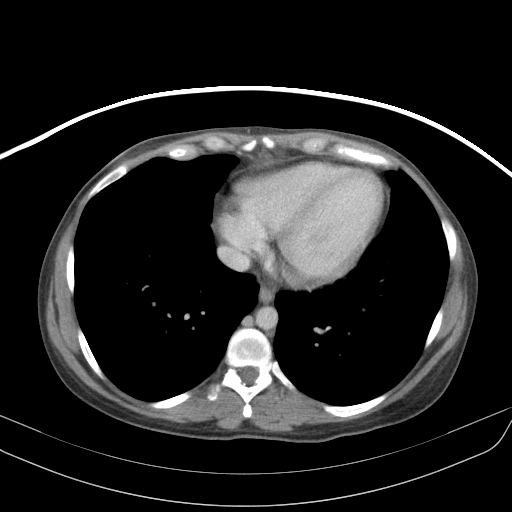
[im 87/94  lung]
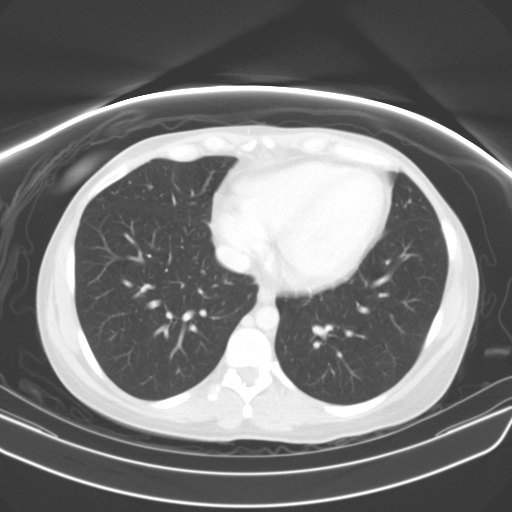

[12 of 32 positions shown; findings below may reference images not displayed]

FINDINGS: Calcified left lower lobe granuloma incidentally noted.
Lung bases otherwise clear.  Sub centimeter too small to
characterize hepatic hypodensities are incidentally noted as well
as a posterior segment 2 cm right hepatic lobe cyst.  Gallbladder,
kidneys, adrenal gland, spleen, and pancreas are unremarkable.

Expansion and central hypodensity of both external and common iliac
veins is noted with extension to the infrarenal IVC.  Surrounding
perivascular stranding is noted.  There also appears to be
intervention of deep vein thrombosis into the left internal iliac
vein.

Congestion of multiple pelvic vessels is noted with enlargement of
the uterus, compatible with postoperative/postpartum appearance.  A
centrally hypodense mass of the uterine fundus posteriorly on image
64 could represent a fibroid.

The left ovarian vein is expanded but no internal filling defect is
seen to suggest thrombosis, allowing for phase of contrast
enhancement. Several prominent pelvic sidewall lymph nodes are
noted.

The bowel is unremarkable.

Asymmetric left lower extremity swelling and subcutaneous edema
noted.

No acute bony abnormality.
IMPRESSION: Extensive deep venous thrombosis extending from both external iliac
veins to the infrarenal IVC.

Pelvic vessel congestion which may be due to the combination of
postpartum change, pelvic congestion syndrome, or possibly venous
thrombosis.

Posterior uterine body probable fibroid with central hypodensity
which is nonspecific.  This may be seen with necrosis but may also
be a chronic finding.

Critical test results telephoned to Dr. Walye by Dr. Bosak at
the time of interpretation on 07/13/09 at [DATE].

## 2015-09-20 ENCOUNTER — Emergency Department (HOSPITAL_COMMUNITY): Payer: BLUE CROSS/BLUE SHIELD

## 2015-09-20 ENCOUNTER — Encounter (HOSPITAL_COMMUNITY): Payer: Self-pay

## 2015-09-20 ENCOUNTER — Emergency Department (HOSPITAL_COMMUNITY)
Admission: EM | Admit: 2015-09-20 | Discharge: 2015-09-20 | Disposition: A | Discharge status: Home / Self Care | Payer: BLUE CROSS/BLUE SHIELD | Attending: Emergency Medicine | Admitting: Emergency Medicine

## 2015-09-20 ENCOUNTER — Emergency Department (HOSPITAL_BASED_OUTPATIENT_CLINIC_OR_DEPARTMENT_OTHER)
Admit: 2015-09-20 | Discharge: 2015-09-20 | Disposition: A | Discharge status: Home / Self Care | Payer: BLUE CROSS/BLUE SHIELD

## 2015-09-20 DIAGNOSIS — Z7901 Long term (current) use of anticoagulants: Secondary | ICD-10-CM | POA: Diagnosis not present

## 2015-09-20 DIAGNOSIS — I82402 Acute embolism and thrombosis of unspecified deep veins of left lower extremity: Secondary | ICD-10-CM | POA: Insufficient documentation

## 2015-09-20 DIAGNOSIS — Z86018 Personal history of other benign neoplasm: Secondary | ICD-10-CM | POA: Insufficient documentation

## 2015-09-20 DIAGNOSIS — M7989 Other specified soft tissue disorders: Secondary | ICD-10-CM

## 2015-09-20 DIAGNOSIS — M549 Dorsalgia, unspecified: Secondary | ICD-10-CM | POA: Diagnosis not present

## 2015-09-20 DIAGNOSIS — R000 Tachycardia, unspecified: Secondary | ICD-10-CM | POA: Insufficient documentation

## 2015-09-20 DIAGNOSIS — M79609 Pain in unspecified limb: Secondary | ICD-10-CM | POA: Diagnosis not present

## 2015-09-20 LAB — BASIC METABOLIC PANEL
Anion gap: 8 (ref 5–15)
BUN: 9 mg/dL (ref 6–20)
CO2: 25 mmol/L (ref 22–32)
Calcium: 9.1 mg/dL (ref 8.9–10.3)
Chloride: 103 mmol/L (ref 101–111)
Creatinine, Ser: 0.67 mg/dL (ref 0.44–1.00)
GFR calc Af Amer: >60 mL/min (ref >60)
GFR calc non Af Amer: >60 mL/min (ref >60)
Glucose, Bld: 102 mg/dL — ABNORMAL HIGH (ref 65–99)
Potassium: 3.9 mmol/L (ref 3.5–5.1)
Sodium: 136 mmol/L (ref 135–145)

## 2015-09-20 LAB — CBC WITH DIFFERENTIAL/PLATELET
Basophils Absolute: 0 10*3/uL (ref 0.0–0.1)
Basophils Relative: 0 %
Eosinophils Absolute: 0 10*3/uL (ref 0.0–0.7)
Eosinophils Relative: 0 %
HCT: 32.3 % — ABNORMAL LOW (ref 36.0–46.0)
Hemoglobin: 10.2 g/dL — ABNORMAL LOW (ref 12.0–15.0)
Lymphocytes Relative: 11 %
Lymphs Abs: 1.1 10*3/uL (ref 0.7–4.0)
MCH: 26.8 pg (ref 26.0–34.0)
MCHC: 31.6 g/dL (ref 30.0–36.0)
MCV: 85 fL (ref 78.0–100.0)
Monocytes Absolute: 1.2 10*3/uL — ABNORMAL HIGH (ref 0.1–1.0)
Monocytes Relative: 12 %
Neutro Abs: 7.6 10*3/uL (ref 1.7–7.7)
Neutrophils Relative %: 77 %
Platelets: 233 10*3/uL (ref 150–400)
RBC: 3.8 MIL/uL — ABNORMAL LOW (ref 3.87–5.11)
RDW: 14.9 % (ref 11.5–15.5)
WBC: 10 10*3/uL (ref 4.0–10.5)

## 2015-09-20 LAB — PROTIME-INR
INR: 1.35 (ref 0.00–1.49)
PROTHROMBIN TIME: 16.8 s — AB (ref 11.6–15.2)

## 2015-09-20 MED ORDER — ACETAMINOPHEN 500 MG PO TABS
1000.0000 mg | ORAL_TABLET | Freq: Once | ORAL | Status: AC
  Administered 2015-09-20: 1000 mg via ORAL
  Filled 2015-09-20: qty 2

## 2015-09-20 MED ORDER — MORPHINE SULFATE (PF) 4 MG/ML IV SOLN: 4.0000 mg | INTRAVENOUS | Status: DC | PRN

## 2015-09-20 MED ORDER — RIVAROXABAN (XARELTO) VTE STARTER PACK (15 & 20 MG): ORAL_TABLET | ORAL | Status: DC

## 2015-09-20 MED ORDER — IOPAMIDOL (ISOVUE-370) INJECTION 76%
INTRAVENOUS | Status: AC
  Administered 2015-09-20: 100 mL
  Filled 2015-09-20: qty 100

## 2015-09-20 MED ORDER — ONDANSETRON HCL 4 MG/2ML IJ SOLN
4.0000 mg | Freq: Once | INTRAMUSCULAR | Status: DC
Start: 2015-09-20 — End: 2015-09-20

## 2015-09-20 NOTE — ED Notes (Signed)
Pt transported to CT ?

## 2015-09-20 NOTE — Discharge Instructions (Signed)
Deep Vein Thrombosis °A deep vein thrombosis (DVT) is a blood clot (thrombus) that usually occurs in a deep, larger vein of the lower leg or the pelvis, or in an upper extremity such as the arm. These are dangerous and can lead to serious and even life-threatening complications if the clot travels to the lungs. °A DVT can damage the valves in your leg veins so that instead of flowing upward, the blood pools in the lower leg. This is called post-thrombotic syndrome, and it can result in pain, swelling, discoloration, and sores on the leg. °CAUSES °A DVT is caused by the formation of a blood clot in your leg, pelvis, or arm. Usually, several things contribute to the formation of blood clots. A clot may develop when: °· Your blood flow slows down. °· Your vein becomes damaged in some way. °· You have a condition that makes your blood clot more easily. °RISK FACTORS °A DVT is more likely to develop in: °· People who are older, especially over 60 years of age. °· People who are overweight (obese). °· People who sit or lie still for a long time, such as during long-distance travel (over 4 hours), bed rest, hospitalization, or during recovery from certain medical conditions like a stroke. °· People who do not engage in much physical activity (sedentary lifestyle). °· People who have chronic breathing disorders. °· People who have a personal or family history of blood clots or blood clotting disease. °· People who have peripheral vascular disease (PVD), diabetes, or some types of cancer. °· People who have heart disease, especially if the person had a recent heart attack or has congestive heart failure. °· People who have neurological diseases that affect the legs (leg paresis). °· People who have had a traumatic injury, such as breaking a hip or leg. °· People who have recently had major or lengthy surgery, especially on the hip, knee, or abdomen. °· People who have had a central line placed inside a large vein. °· People  who take medicines that contain the hormone estrogen. These include birth control pills and hormone replacement therapy. °· Pregnancy or during childbirth or the postpartum period. °· Long plane flights (over 8 hours). °SIGNS AND SYMPTOMS °Symptoms of a DVT can include:  °· Swelling of your leg or arm, especially if one side is much worse. °· Warmth and redness of your leg or arm, especially if one side is much worse. °· Pain in your arm or leg. If the clot is in your leg, symptoms may be more noticeable or worse when you stand or walk. °· A feeling of pins and needles, if the clot is in the arm. °The symptoms of a DVT that has traveled to the lungs (pulmonary embolism, PE) usually start suddenly and include: °· Shortness of breath while active or at rest. °· Coughing or coughing up blood or blood-tinged mucus. °· Chest pain that is often worse with deep breaths. °· Rapid or irregular heartbeat. °· Feeling light-headed or dizzy. °· Fainting. °· Feeling anxious. °· Sweating. °There may also be pain and swelling in a leg if that is where the blood clot started. °These symptoms may represent a serious problem that is an emergency. Do not wait to see if the symptoms will go away. Get medical help right away. Call your local emergency services (911 in the [place] Do not drive yourself to the hospital. °DIAGNOSIS °Your health care provider will take a medical history and perform a physical exam. You may also   have other tests, including: °· Blood tests to assess the clotting properties of your blood. °· Imaging tests, such as CT, ultrasound, MRI, X-ray, and other tests to see if you have clots anywhere in your body. °TREATMENT °After a DVT is identified, it can be treated. The type of treatment that you receive depends on many factors, such as the cause of your DVT, your risk for bleeding or developing more clots, and other medical conditions that you have. Sometimes, a combination of treatments is necessary. Treatment  options may be combined and include: °· Monitoring the blood clot with ultrasound. °· Taking medicines by mouth, such as newer blood thinners (anticoagulants), thrombolytics, or warfarin. °· Taking anticoagulant medicine by injection or through an IV tube. °· Wearing compression stockings or using different types of devices. °· Surgery (rare) to remove the blood clot or to place a filter in your abdomen to stop the blood clot from traveling to your lungs. °Treatments for a DVT are often divided into immediate treatment and long-term treatment (up to 3 months after DVT). You can work with your health care provider to choose the treatment program that is best for you. °HOME CARE INSTRUCTIONS °If you are taking a newer oral anticoagulant: °· Take the medicine every single day at the same time each day. °· Understand what foods and drugs interact with this medicine. °· Understand that there are no regular blood tests required when using this medicine. °· Understand the side effects of this medicine, including excessive bruising or bleeding. Ask your health care provider or pharmacist about other possible side effects. °If you are taking warfarin: °· Understand how to take warfarin and know which foods can affect how warfarin works in your body. °· Understand that it is dangerous to take too much or too little warfarin. Too much warfarin increases the risk of bleeding. Too little warfarin continues to allow the risk for blood clots. °· Follow your PT and INR blood testing schedule. The PT and INR results allow your health care provider to adjust your dose of warfarin. It is very important that you have your PT and INR tested as often as told by your health care provider. °· Avoid major changes in your diet, or tell your health care provider before you change your diet. Arrange a visit with a registered dietitian to answer your questions. Many foods, especially foods that are high in vitamin K, can interfere with warfarin  and affect the PT and INR results. Eat a consistent amount of foods that are high in vitamin K, such as: °¨ Spinach, kale, broccoli, cabbage, collard greens, turnip greens, Brussels sprouts, peas, cauliflower, seaweed, and parsley. °¨ Beef liver and pork liver. °¨ Green tea. °¨ Soybean oil. °· Tell your health care provider about any and all medicines, vitamins, and supplements that you take, including aspirin and other over-the-counter anti-inflammatory medicines. Be especially cautious with aspirin and anti-inflammatory medicines. Do not take those before you ask your health care provider if it is safe to do so. This is important because many medicines can interfere with warfarin and affect the PT and INR results. °· Do not start or stop taking any over-the-counter or prescription medicine unless your health care provider or pharmacist tells you to do so. °If you take warfarin, you will also need to do these things: °· Hold pressure over cuts for longer than usual. °· Tell your dentist and other health care providers that you are taking warfarin before you have any procedures in which   bleeding may occur. °· Avoid alcohol or drink very small amounts. Tell your health care provider if you change your alcohol intake. °· Do not use tobacco products, including cigarettes, chewing tobacco, and e-cigarettes. If you need help quitting, ask your health care provider. °· Avoid contact sports. °General Instructions °· Take over-the-counter and prescription medicines only as told by your health care provider. Anticoagulant medicines can have side effects, including easy bruising and difficulty stopping bleeding. If you are prescribed an anticoagulant, you will also need to do these things: °¨ Hold pressure over cuts for longer than usual. °¨ Tell your dentist and other health care providers that you are taking anticoagulants before you have any procedures in which bleeding may occur. °¨ Avoid contact sports. °· Wear a medical  alert bracelet or carry a medical alert card that says you have had a PE. °· Ask your health care provider how soon you can go back to your normal activities. Stay active to prevent new blood clots from forming. °· Make sure to exercise while traveling or when you have been sitting or standing for a long period of time. It is very important to exercise. Exercise your legs by walking or by tightening and relaxing your leg muscles often. Take frequent walks. °· Wear compression stockings as told by your health care provider to help prevent more blood clots from forming. °· Do not use tobacco products, including cigarettes, chewing tobacco, and e-cigarettes. If you need help quitting, ask your health care provider. °· Keep all follow-up appointments with your health care provider. This is important. °PREVENTION °Take these actions to decrease your risk of developing another DVT: °· Exercise regularly. For at least 30 minutes every day, engage in: °¨ Activity that involves moving your arms and legs. °¨ Activity that encourages good blood flow through your body by increasing your heart rate. °· Exercise your arms and legs every hour during long-distance travel (over 4 hours). Drink plenty of water and avoid drinking alcohol while traveling. °· Avoid sitting or lying in bed for long periods of time without moving your legs. °· Maintain a weight that is appropriate for your height. Ask your health care provider what weight is healthy for you. °· If you are a woman who is over 35 years of age, avoid unnecessary use of medicines that contain estrogen. These include birth control pills. °· Do not smoke, especially if you take estrogen medicines. If you need help quitting, ask your health care provider. °If you are hospitalized, prevention measures may include: °· Early walking after surgery, as soon as your health care provider says that it is safe. °· Receiving anticoagulants to prevent blood clots. If you cannot take  anticoagulants, other options may be available, such as wearing compression stockings or using different types of devices. °SEEK IMMEDIATE MEDICAL CARE IF: °· You have new or increased pain, swelling, or redness in an arm or leg. °· You have numbness or tingling in an arm or leg. °· You have shortness of breath while active or at rest. °· You have chest pain. °· You have a rapid or irregular heartbeat. °· You feel light-headed or dizzy. °· You cough up blood. °· You notice blood in your vomit, bowel movement, or urine. °These symptoms may represent a serious problem that is an emergency. Do not wait to see if the symptoms will go away. Get medical help right away. Call your local emergency services (911 in the [place] Do not drive yourself to the hospital. °  °  This information is not intended to replace advice given to you by your health care provider. Make sure you discuss any questions you have with your health care provider. °  °Document Released: 04/10/2005 Document Revised: 12/30/2014 Document Reviewed: 08/05/2014 °[employer]vier Interactive Patient Education ©2016 [employer] ° °

## 2015-09-20 NOTE — ED Notes (Signed)
Per Pt, Pt started to notice swelling in her left leg after a trip to [place] last week that continued throughout the week associated with pain. Pt has Hx of DVT after pregnancy five years ago with Coumadin therapy for one year and fibroid in her uterus that causes intermittent pain.  Pt reports pain in lower back with turning that has started since the swelling last week. Pulses noted to the left foot with swelling noted. Pulses present. Denies Chest pain or SOB.

## 2015-09-20 NOTE — Progress Notes (Signed)
VASCULAR LAB PRELIMINARY  PRELIMINARY  PRELIMINARY  PRELIMINARY  Bilateral lower extremity venous duplex completed.    Preliminary report:  There is acute DVT noted in the left common femoral, femoral, profunda, and popliteal veins.  No propagation noted to the right lower extremity.   Called report to Dr. Chaya Jan, [facility], [facility] 09/20/2015, 12:25 PM

## 2015-09-20 NOTE — ED Notes (Signed)
Dr. James at bedside  

## 2015-09-20 NOTE — ED Notes (Signed)
Fever noted to be 102.5.  Dr. Laneta Simmers made aware.  Dr. Laneta Simmers at bedside.  New order for 1000mg  tylenol received.  Pt advised if temperature not improved with tylenol and/or ibuprofen to seek follow up care.  Pt verbalized understanding.

## 2015-09-20 NOTE — ED Provider Notes (Signed)
Care assumed from Dr. Jeneen Rinks at 52 with plan for f/u abdominal and chest CT to r/o PE and proximal occlusive DVT that would need lysis.   Results:  BP 111/59 mmHg  Pulse 101  Temp(Src) 98.6 F (37 C) (Oral)  Resp 16  Ht 5\' 1"  (1.549 m)  Wt 131 lb (59.421 kg)  BMI 24.76 kg/m2  SpO2 98%  LMP 09/17/2015  Results for orders placed or performed during the hospital encounter of 09/20/15  CBC with Differential/Platelet  Result Value Ref Range   WBC 10.0 4.0 - 10.5 K/uL   RBC 3.80 (L) 3.87 - 5.11 MIL/uL   Hemoglobin 10.2 (L) 12.0 - 15.0 g/dL   HCT 32.3 (L) 36.0 - 46.0 %   MCV 85.0 78.0 - 100.0 fL   MCH 26.8 26.0 - 34.0 pg   MCHC 31.6 30.0 - 36.0 g/dL   RDW 14.9 11.5 - 15.5 %   Platelets 233 150 - 400 K/uL   Neutrophils Relative % 77 %   Neutro Abs 7.6 1.7 - 7.7 K/uL   Lymphocytes Relative 11 %   Lymphs Abs 1.1 0.7 - 4.0 K/uL   Monocytes Relative 12 %   Monocytes Absolute 1.2 (H) 0.1 - 1.0 K/uL   Eosinophils Relative 0 %   Eosinophils Absolute 0.0 0.0 - 0.7 K/uL   Basophils Relative 0 %   Basophils Absolute 0.0 0.0 - 0.1 K/uL  Basic metabolic panel  Result Value Ref Range   Sodium 136 135 - 145 mmol/L   Potassium 3.9 3.5 - 5.1 mmol/L   Chloride 103 101 - 111 mmol/L   CO2 25 22 - 32 mmol/L   Glucose, Bld 102 (H) 65 - 99 mg/dL   BUN 9 6 - 20 mg/dL   Creatinine, Ser 0.67 0.44 - 1.00 mg/dL   Calcium 9.1 8.9 - 10.3 mg/dL   GFR calc non Af Amer >60 >60 mL/min   GFR calc Af Amer >60 >60 mL/min   Anion gap 8 5 - 15  Protime-INR  Result Value Ref Range   Prothrombin Time 16.8 (H) 11.6 - 15.2 seconds   INR 1.35 0.00 - 1.49    Ct Angio Chest Pe W/cm &/or Wo Cm  09/20/2015  CLINICAL DATA:  40 year old female with a history of IVC and bilateral iliac deep venous thrombosis in 2011, now presenting with left upper extremity swelling and left lower extremity deep venous thrombosis on emergency department ultrasound. EXAM: CT ANGIOGRAPHY CHEST CT ABDOMEN AND PELVIS WITH CONTRAST  TECHNIQUE: Multidetector CT imaging of the chest was performed using the standard protocol during bolus administration of intravenous contrast. Multiplanar CT image reconstructions and MIPs were obtained to evaluate the vascular anatomy. Multidetector CT imaging of the abdomen and pelvis was performed using the standard protocol during bolus administration of intravenous contrast. CONTRAST:  100 cc Isovue 370 IV. COMPARISON:  09/06/2009 chest CT angiogram and 07/13/2009 CT abdomen/ pelvis. FINDINGS: CTA CHEST FINDINGS Mediastinum/Nodes: The study is high quality for the evaluation of pulmonary embolism. There are no filling defects in the central, lobar, segmental or subsegmental pulmonary artery branches to suggest acute pulmonary embolism. Great vessels are normal in course and caliber. Normal heart size. Trace pericardial fluid/thickening. Normal visualized thyroid. Normal esophagus. No pathologically enlarged axillary, mediastinal or hilar lymph nodes. Stable coarsely calcified left hilar lymph nodes from prior granulomatous disease. Lungs/Pleura: No pneumothorax. No pleural effusion. Stable 7 mm calcified granuloma in the posterior left lower lobe. No acute consolidative airspace disease, significant pulmonary nodules  or lung masses. Musculoskeletal:  No aggressive appearing focal osseous lesions. CT ABDOMEN and PELVIS FINDINGS Hepatobiliary: There are 5 simple liver cysts scattered throughout the liver, largest 3.3 cm in the medial lower right liver lobe. There are numerous additional subcentimeter hypodense lesions scattered throughout the liver (greater than 20), which are too small to characterize, many of which were present on 07/13/2009, however these are mildly increased in size and number. Normal gallbladder with no radiopaque cholelithiasis. No biliary ductal dilatation. Pancreas: Normal, with no mass or duct dilation. Spleen: Normal size. No mass. Adrenals/Urinary Tract: Normal adrenals. No  hydronephrosis. No renal mass. Normal bladder. Stomach/Bowel: Grossly normal stomach. Normal caliber small bowel with no small bowel wall thickening. Normal appendix. Normal large bowel with no diverticulosis, large bowel wall thickening or pericolonic fat stranding. Vascular/Lymphatic: Normal caliber abdominal aorta. Patent portal, splenic, hepatic and renal veins. The suprarenal IVC is patent. There is an infrarenal IVC stent extending inferiorly from the L3 level into the bilateral iliac veins, terminating in the proximal external iliac veins bilaterally. The infrarenal IVC is patent above the stent. There is nonocclusive chronic eccentric thrombus within the IVC portion of the stent. There is high-grade narrowing of the stented left common iliac vein by the markedly enlarged uterus. There is probable near occlusive thrombosis of the stented left common iliac vein. There is heterogeneous enhancement of the left external iliac vein and left common femoral vein with surrounding fat stranding and associated wall thickening in these veins, suggestive of acute nonocclusive thrombosis in these veins. There is nonocclusive peripheral chronic thrombosis of the stented right common iliac vein. There is high-grade narrowing of the right external iliac vein at the stent terminus due to extrinsic mass effect by the markedly enlarged uterus. No definite thrombosis within the right external iliac vein or right common femoral vein. There are a patent [place] spinous venous collaterals. There a few patent superficial venous collaterals in the anterior left proximal thigh and ventral pelvic wall. No pathologically enlarged lymph nodes in the abdomen or pelvis. Reproductive: The uterus is markedly enlarged, measuring 24.1 x 12.2 x 17.8 cm in size. There is a probable dominant 19.0 x 11.6 x 16.7 cm posterior uterine body transmural fibroid, which has markedly enlarged since 07/13/2009. No separate adnexal masses appreciated. The left  ovarian vein is prominently asymmetrically enlarged. Other: No pneumoperitoneum, ascites or focal fluid collection. Musculoskeletal: No aggressive appearing focal osseous lesions. Review of the MIP images confirms the above findings. IMPRESSION: 1. No pulmonary embolism.  No active disease in the chest. 2. Markedly enlarged myomatous uterus with a dominant 19.0 x 11.6 x 16.7 cm posterior uterine body transmural fibroid. 3. High-grade narrowing of the stented portions of the right external iliac vein and left common iliac vein due to extrinsic mass effect by the markedly enlarged uterus. 4. Acute deep venous thrombosis within the left common femoral vein, left external iliac vein and left common iliac vein, which appears non-occlusive to near-occlusive. 5. Chronic-appearing eccentric non-occlusive deep venous thrombosis within the stented portion of the infrarenal IVC and right iliac veins. The IVC above the stent and renal veins are patent. Electronically Signed   By: Ilona Sorrel M.D.   On: 09/20/2015 16:36   Ct Abdomen Pelvis W Contrast  09/20/2015  CLINICAL DATA:  40 year old female with a history of IVC and bilateral iliac deep venous thrombosis in 2011, now presenting with left upper extremity swelling and left lower extremity deep venous thrombosis on emergency department ultrasound. EXAM: CT  ANGIOGRAPHY CHEST CT ABDOMEN AND PELVIS WITH CONTRAST TECHNIQUE: Multidetector CT imaging of the chest was performed using the standard protocol during bolus administration of intravenous contrast. Multiplanar CT image reconstructions and MIPs were obtained to evaluate the vascular anatomy. Multidetector CT imaging of the abdomen and pelvis was performed using the standard protocol during bolus administration of intravenous contrast. CONTRAST:  100 cc Isovue 370 IV. COMPARISON:  09/06/2009 chest CT angiogram and 07/13/2009 CT abdomen/ pelvis. FINDINGS: CTA CHEST FINDINGS Mediastinum/Nodes: The study is high quality for  the evaluation of pulmonary embolism. There are no filling defects in the central, lobar, segmental or subsegmental pulmonary artery branches to suggest acute pulmonary embolism. Great vessels are normal in course and caliber. Normal heart size. Trace pericardial fluid/thickening. Normal visualized thyroid. Normal esophagus. No pathologically enlarged axillary, mediastinal or hilar lymph nodes. Stable coarsely calcified left hilar lymph nodes from prior granulomatous disease. Lungs/Pleura: No pneumothorax. No pleural effusion. Stable 7 mm calcified granuloma in the posterior left lower lobe. No acute consolidative airspace disease, significant pulmonary nodules or lung masses. Musculoskeletal:  No aggressive appearing focal osseous lesions. CT ABDOMEN and PELVIS FINDINGS Hepatobiliary: There are 5 simple liver cysts scattered throughout the liver, largest 3.3 cm in the medial lower right liver lobe. There are numerous additional subcentimeter hypodense lesions scattered throughout the liver (greater than 20), which are too small to characterize, many of which were present on 07/13/2009, however these are mildly increased in size and number. Normal gallbladder with no radiopaque cholelithiasis. No biliary ductal dilatation. Pancreas: Normal, with no mass or duct dilation. Spleen: Normal size. No mass. Adrenals/Urinary Tract: Normal adrenals. No hydronephrosis. No renal mass. Normal bladder. Stomach/Bowel: Grossly normal stomach. Normal caliber small bowel with no small bowel wall thickening. Normal appendix. Normal large bowel with no diverticulosis, large bowel wall thickening or pericolonic fat stranding. Vascular/Lymphatic: Normal caliber abdominal aorta. Patent portal, splenic, hepatic and renal veins. The suprarenal IVC is patent. There is an infrarenal IVC stent extending inferiorly from the L3 level into the bilateral iliac veins, terminating in the proximal external iliac veins bilaterally. The infrarenal IVC  is patent above the stent. There is nonocclusive chronic eccentric thrombus within the IVC portion of the stent. There is high-grade narrowing of the stented left common iliac vein by the markedly enlarged uterus. There is probable near occlusive thrombosis of the stented left common iliac vein. There is heterogeneous enhancement of the left external iliac vein and left common femoral vein with surrounding fat stranding and associated wall thickening in these veins, suggestive of acute nonocclusive thrombosis in these veins. There is nonocclusive peripheral chronic thrombosis of the stented right common iliac vein. There is high-grade narrowing of the right external iliac vein at the stent terminus due to extrinsic mass effect by the markedly enlarged uterus. No definite thrombosis within the right external iliac vein or right common femoral vein. There are a patent [place] spinous venous collaterals. There a few patent superficial venous collaterals in the anterior left proximal thigh and ventral pelvic wall. No pathologically enlarged lymph nodes in the abdomen or pelvis. Reproductive: The uterus is markedly enlarged, measuring 24.1 x 12.2 x 17.8 cm in size. There is a probable dominant 19.0 x 11.6 x 16.7 cm posterior uterine body transmural fibroid, which has markedly enlarged since 07/13/2009. No separate adnexal masses appreciated. The left ovarian vein is prominently asymmetrically enlarged. Other: No pneumoperitoneum, ascites or focal fluid collection. Musculoskeletal: No aggressive appearing focal osseous lesions. Review of the MIP images confirms  the above findings. IMPRESSION: 1. No pulmonary embolism.  No active disease in the chest. 2. Markedly enlarged myomatous uterus with a dominant 19.0 x 11.6 x 16.7 cm posterior uterine body transmural fibroid. 3. High-grade narrowing of the stented portions of the right external iliac vein and left common iliac vein due to extrinsic mass effect by the markedly  enlarged uterus. 4. Acute deep venous thrombosis within the left common femoral vein, left external iliac vein and left common iliac vein, which appears non-occlusive to near-occlusive. 5. Chronic-appearing eccentric non-occlusive deep venous thrombosis within the stented portion of the infrarenal IVC and right iliac veins. The IVC above the stent and renal veins are patent. Electronically Signed   By: Ilona Sorrel M.D.   On: 09/20/2015 16:36    Radiology and laboratory examinations were reviewed by me and used in medical decision making if performed.   MDM:  40 y.o. female presents with left lower extremity DVT that is recurrent. Not occlusive on scan, no PE or other proximal extension that would necessitate lysis. Pt has fever on discharge but otherwise well appearing, no skin breakdown or signs of active infection. She states she had fever with last DVT as well so may be a SIRS response. No elevation of WBC or evidence of sepsis clinically. She has a markedly large fibroid that is pressing against her spine and great vessels that is likely contributing to decreased venous flow and clot formation. She was recommended to obtain OB/Gyn follow up for removal which would likely improve her situation dramatically. Sent home on xarelto with return precautions for fever persisting longer than the next 2-3 days or changes in symptoms. Plan to follow up with PCP as needed and return precautions discussed for worsening or new concerning symptoms.   Diagnoses that have been ruled out:  None  Diagnoses that are still under consideration:  None  Final diagnoses:  DVT (deep venous thrombosis), left     Leo Grosser, MD 09/21/15 [id number]

## 2015-09-20 NOTE — ED Provider Notes (Signed)
CSN: [id number]     Arrival date & time 09/20/15  1034 History   First MD Initiated Contact with Patient 09/20/15 1126     Chief Complaint  Patient presents with  . Leg Swelling      HPI  Patient presents for evaluation with concern of a DVT in her left leg. History of a DVT 2012 postpartum. Required extensive intervention for this as it was bilateral iliac veins as well as IVC. Required admission for thrombectomy and TPN infusions.  Is on Coumadin for 1 year. Has been symptom-free other than occasional swelling for the last 3 years.  Her husband drove back and [place] in the last week. She does increasing pain in the left leg. Has some pain low in her back. No shortness of breath 6 beer presyncopal. He is currently measuring patient is a history of heavy periods. This was "almost gone". His never been told she was anemic.  Past Medical History  Diagnosis Date  . Uterine fibroid   . DVT (deep vein thrombosis) in pregnancy 06/2009    extensive iliofemoral DVT, left lower extremity; s/p percutaneous thrombectomy done by Dr. Barbie Banner (07/18/2009), started on Coumadin at that time  . Palpitations 01/31/2010    on and off for past several weeks, now asymptomatic   Past Surgical History  Procedure Laterality Date  . Cesarean section  06/2009  . Thrombectomy  2003    done  By Dr. Barbie Banner   Family History  Problem Relation Age of Onset  . Stroke Neg Hx   . Heart disease Neg Hx   . Cancer Neg Hx    Social History  Substance Use Topics  . Smoking status: Never Smoker   . Smokeless tobacco: None  . Alcohol Use: No   OB History    No data available     Review of Systems  Constitutional: Negative for fever, chills, diaphoresis, appetite change and fatigue.  HENT: Negative for mouth sores, sore throat and trouble swallowing.   Eyes: Negative for visual disturbance.  Respiratory: Negative for cough, chest tightness, shortness of breath and wheezing.   Cardiovascular: Positive  for leg swelling. Negative for chest pain.  Gastrointestinal: Negative for nausea, vomiting, abdominal pain, diarrhea and abdominal distention.  Endocrine: Negative for polydipsia, polyphagia and polyuria.  Genitourinary: Negative for dysuria, frequency and hematuria.  Musculoskeletal: Positive for back pain. Negative for gait problem.  Skin: Negative for color change, pallor and rash.  Neurological: Negative for dizziness, syncope, light-headedness and headaches.  Hematological: Does not bruise/bleed easily.  Psychiatric/Behavioral: Negative for behavioral problems and confusion.      Allergies  Heparin  Home Medications   Prior to Admission medications   Medication Sig Start Date End Date Taking? Authorizing Provider  benzocaine-menthol (CHLORAEPTIC) 6-10 MG lozenge Take 1 lozenge by mouth daily as needed for sore throat.   Yes Historical Provider, MD  Rivaroxaban 15 & 20 MG TBPK Take as directed on package: Start with one 15mg  tablet by mouth twice a day with food. On Day 22, switch to one 20mg  tablet once a day with food. 09/20/15   Leo Grosser, MD   BP 110/57 mmHg  Pulse 100  Temp(Src) 102.5 F (39.2 C) (Oral)  Resp 18  Ht 5\' 1"  (1.549 m)  Wt 131 lb (59.421 kg)  BMI 24.76 kg/m2  SpO2 97%  LMP 09/17/2015 Physical Exam  Constitutional: She is oriented to person, place, and time. She appears well-developed and well-nourished. No distress.  HENT:  Head:  Normocephalic.  Eyes: Conjunctivae are normal. Pupils are equal, round, and reactive to light. No scleral icterus.  Neck: Normal range of motion. Neck supple. No thyromegaly present.  Cardiovascular: Normal rate and regular rhythm.  Exam reveals no gallop and no friction rub.   No murmur heard. Resting tachycardia 103.. Sinus rhythm on monitor. 98% saturations.  Pulmonary/Chest: Effort normal and breath sounds normal. No respiratory distress. She has no wheezes. She has no rales.  Abdominal: Soft. Bowel sounds are normal.  She exhibits no distension. There is no tenderness. There is no rebound.  Musculoskeletal: Normal range of motion.  Neurological: She is alert and oriented to person, place, and time.  Skin: Skin is warm and dry. No rash noted.     Psychiatric: She has a normal mood and affect. Her behavior is normal.    ED Course  Procedures (including critical care time) Labs Review Labs Reviewed  CBC WITH DIFFERENTIAL/PLATELET - Abnormal; Notable for the following:    RBC 3.80 (*)    Hemoglobin 10.2 (*)    HCT 32.3 (*)    Monocytes Absolute 1.2 (*)    All other components within normal limits  BASIC METABOLIC PANEL - Abnormal; Notable for the following:    Glucose, Bld 102 (*)    All other components within normal limits  PROTIME-INR - Abnormal; Notable for the following:    Prothrombin Time 16.8 (*)    All other components within normal limits    Imaging Review No results found. I have personally reviewed and evaluated these images and lab results as part of my medical decision-making.   EKG Interpretation None      MDM   Final diagnoses:  DVT (deep venous thrombosis), left    Doppler ultrasound shows common femoral, and popliteal vein. Remainder the distal veins were patent by ultrasound. Not able to visualize iliac vein above the ligament.  Requested CT imaging of the chest with consideration of patient's resting tachycardia. Also contrast CT the abdomen to ensure that this is not propagated to her IVC as her previous DVT did.  If her CTA and CT of her abdomen are negative and this is a simple femoral popliteal DVT she can be safely discharged on xarelto.      Tanna Furry, MD 09/25/15 2053

## 2015-09-23 ENCOUNTER — Telehealth: Payer: Self-pay | Admitting: *Deleted

## 2015-09-27 ENCOUNTER — Telehealth: Payer: Self-pay | Admitting: Hematology and Oncology

## 2015-09-27 NOTE — Telephone Encounter (Signed)
Verified address and insurance, pt confirmed appt date/time. All her questions were answered.

## 2015-09-29 ENCOUNTER — Ambulatory Visit (HOSPITAL_BASED_OUTPATIENT_CLINIC_OR_DEPARTMENT_OTHER): Payer: BLUE CROSS/BLUE SHIELD | Admitting: Hematology and Oncology

## 2015-09-29 ENCOUNTER — Ambulatory Visit: Payer: BLUE CROSS/BLUE SHIELD | Admitting: Hematology and Oncology

## 2015-09-29 ENCOUNTER — Encounter: Payer: Self-pay | Admitting: Hematology and Oncology

## 2015-09-29 VITALS — BP 121/37 | HR 94 | Temp 98.1°F | Resp 17 | Ht 61.0 in | Wt 128.9 lb

## 2015-09-29 DIAGNOSIS — Z95820 Peripheral vascular angioplasty status with implants and grafts: Secondary | ICD-10-CM

## 2015-09-29 DIAGNOSIS — I82402 Acute embolism and thrombosis of unspecified deep veins of left lower extremity: Secondary | ICD-10-CM

## 2015-09-29 DIAGNOSIS — I82502 Chronic embolism and thrombosis of unspecified deep veins of left lower extremity: Secondary | ICD-10-CM | POA: Diagnosis not present

## 2015-09-29 DIAGNOSIS — D259 Leiomyoma of uterus, unspecified: Secondary | ICD-10-CM

## 2015-09-29 DIAGNOSIS — I871 Compression of vein: Secondary | ICD-10-CM | POA: Diagnosis not present

## 2015-09-29 DIAGNOSIS — D638 Anemia in other chronic diseases classified elsewhere: Secondary | ICD-10-CM

## 2015-09-30 ENCOUNTER — Telehealth: Payer: Self-pay | Admitting: *Deleted

## 2015-09-30 DIAGNOSIS — D638 Anemia in other chronic diseases classified elsewhere: Secondary | ICD-10-CM | POA: Insufficient documentation

## 2015-09-30 DIAGNOSIS — Z95820 Peripheral vascular angioplasty status with implants and grafts: Secondary | ICD-10-CM | POA: Insufficient documentation

## 2015-09-30 DIAGNOSIS — I871 Compression of vein: Secondary | ICD-10-CM | POA: Insufficient documentation

## 2015-09-30 DIAGNOSIS — D259 Leiomyoma of uterus, unspecified: Secondary | ICD-10-CM | POA: Insufficient documentation

## 2015-09-30 NOTE — Assessment & Plan Note (Signed)
The patient was not on any long-term anticoagulation therapy prior to the diagnosis of blood clot. Due to presence of the stents, she will remain on anticoagulation therapy indefinitely

## 2015-09-30 NOTE — Assessment & Plan Note (Addendum)
I have a long discussion with the patient and her husband. I felt that the recurrent DVT diagnosed recently was likely precipitated by prolonged immobilization with long car ride on background history of stent placement and possible component of May Thurner syndrome with compression of the left iliac veins from her large uterine fibroid. We discussed choice of anticoagulation therapy. She is doing well on Xarelto for now. Duration of anticoagulation therapy would be lifelong due to presence of stents and recurrent thrombosis even though her first episode of thrombosis was provoked by postoperative complication from emergency C-section in 2011. The presence of stents, while protective to maintain venous patency several years ago, could now predispose her to recurrence of clots in the future.

## 2015-09-30 NOTE — Telephone Encounter (Signed)
Dr. Alvy Bimler needs to discuss pt's case w/ Dr. Ouida Sills. Called Dr. Tonette Bihari office and s/w Sherrine Maples.  Dr. Ouida Sills is out of office until Monday.  Left message for him to please call Dr. Alvy Bimler on her cell phone when her returns.  I faxed over office note to their office [phone].

## 2015-09-30 NOTE — Assessment & Plan Note (Addendum)
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 However, with recent extensive blood clot, I would recommend minimum 30 days of anticoagulation therapy without interruption, preferably 90 days if possible before we proceed with hysterectomy. She would need aggressive perioperative anticoagulation management with possible bridging therapy with Lovenox before surgery and to resume Lovenox after surgery to reduce the risks of postoperative complication She had questionable history of heparin-induced thrombocytopenia in the past although I could not see any real documentation related to this.  Recurrent HIT with anamnestic response of heparin exposure is rare. One could consider using fondaparinux as bridging therapy as opposed to Lovenox. I will discuss her case at the next hematology tumor board

## 2015-09-30 NOTE — Assessment & Plan Note (Addendum)
She has large and significant uterine fibroids compressing on vasculature in her abdomen and predispose her to recurrent DVT. She would benefit from hysterectomy but in view of recent blood clot, I will wait minimum of 30 days, preferably 60-90 days from recent diagnosis of DVT before we proceed with hysterectomy I will coordinate care with her surgeon/GYN for further discussion about plan of care and the timing of her future surgery

## 2015-09-30 NOTE — Progress Notes (Signed)
[facility] CONSULT NOTE  Patient Care Team: Merwyn Katos as PCP - General (Physician Assistant) Heath Lark, MD as Consulting Physician (Hematology and Oncology) Olga Millers, MD as Consulting Physician (Obstetrics and Gynecology)  CHIEF COMPLAINTS/PURPOSE OF CONSULTATION:  Recurrent left lower extremity DVT  HISTORY OF PRESENTING ILLNESS:  Amy Cole 40 y.o. female is here because of recent diagnosis of recurrent left lower extremity DVT. I review her records extensively and collaborated the history with the patient. This patient has extensive left lower extremity requiring stent placement, angioplasty and thrombolytic therapy in 2011. Her extensive DVT happened after emergency C-section for her third child. Her first 2 children were born with spontaneous vaginal delivery. The patient is known to have large uterine fibroids and with her third pregnancy, she was not able to progress along with the normal spontaneous vaginal delivery. After the emergency C-section, she feels well and was discharged 2 or 3 days postoperatively. At home, she developed onset of fever and left lower extremity swelling. She saw her obstetrician regarding her symptoms and according to her, they did not do any further workup pertaining to that. She was subsequently admitted to the hospital after she was found to have significant left lower extremity blood clot. She was started on heparin therapy and there was evidence of thrombocytopenia. Someone raised the possible diagnosis of heparin-induced thrombocytopenia although I did not see a HIT panel being drawn. Unfortunately, I was not able to review those records.  She had multiple sessions of thrombolytic therapy between 07/13/2015 to 07/19/2015. On the last day of the procedure on 07/19/2015, the interventional radiologist commented on this: "After 3 days of gentle lysis, there was some improvement but residual chronic and acute  thrombus. The Angiojet device was utilized with angioplasty alone resulting in further improvement but persistent narrowing of the bilateral iliac venous system as well as some scarring in the lower IVC. Stents were placed from the external iliac veins to the IVC at the L3 level, well below the renal vein inflow. This resulted in a satisfactory result with antegrade flow bilaterally and patency. Life long anticoagulation is recommended to ensure patency of the iliac venous system" The patient was subsequently discharged home on warfarin therapy.  She almost completed one year's worth of anticoagulation therapy without complications. Subsequently, she saw an internist around February 2012 who recommended consideration for stopping anticoagulation therapy. She stopped warfarin therapy around March 2012.  She has been doing well ever since then until recently. She drove to [place] to visit family members between 09/10/2015 to 09/13/2015. It was a 10 hours' drive each way and she remembers stopping periodically. Soon after that, she complained of left lower extremity pain and edema. She was taking Tylenol and ibuprofen but that did not improve her symptoms.  On 09/20/2015, she presented to the emergency department for further evaluation. Ultrasound venous Doppler which showed findings consistent with acute deep vein thrombosis involving the common femoral, femoral, profunda, and popliteal veins of the left lower extremity. CT angiogram was performed which showed no pulmonary embolism. 2. Markedly enlarged myomatous uterus with a dominant 19.0 x 11.6 x 16.7 cm posterior uterine body transmural fibroid. 3. High-grade narrowing of the stented portions of the right external iliac vein and left common iliac vein due to extrinsic mass effect by the markedly enlarged uterus. 4. Acute deep venous thrombosis within the left common femoral vein, left external iliac vein and left common iliac vein, which appears  non-occlusive to near-occlusive.  5. Chronic-appearing eccentric non-occlusive deep venous thrombosis within the stented portion of the infrarenal IVC and right iliac veins. The IVC above the stent and renal veins are patent.  She was discharged from the emergency department on Xarelto. She denies bleeding complication. She continues to take Tylenol and ibuprofen in an alternative fashion every 6 hours for left lower extremity discomfort. She has mild limitation of physical activity due to that.  From the uterine fibroid standpoint, she has heavy menstruation. She has heavy periods for 5 days with cycle every 26 days. She follows with a gynecologist with no definitive plan for hysterectomy prior to recent diagnosis of blood clot. With changes of her menstrual cycle, she has periodic intermittent swelling of her abdomen She denies recent chest pain on exertion, shortness of breath on minimal exertion, pre-syncopal episodes, hemoptysis, or palpitation. She denies recent history of trauma, dehydration, recent surgery, smoking or prolonged immobilization. She had no prior history or diagnosis of cancer. Her age appropriate screening programs are up-to-date. The patient had been pregnant before and denies history of peripartum thromboembolic event or history of recurrent miscarriages until 2011 as above. There is no family history of blood clots or miscarriages.  MEDICAL HISTORY:  Past Medical History  Diagnosis Date  . Uterine fibroid   . DVT (deep vein thrombosis) in pregnancy 06/2009    extensive iliofemoral DVT, left lower extremity; s/p percutaneous thrombectomy done by Dr. Barbie Banner (07/18/2009), started on Coumadin at that time  . Palpitations 01/31/2010    on and off for past several weeks, now asymptomatic    SURGICAL HISTORY: Past Surgical History  Procedure Laterality Date  . Cesarean section  06/2009  . Thrombectomy  2003    done  By Dr. Barbie Banner    SOCIAL HISTORY: Social History    Social History  . Marital Status: Married    Spouse Name: N/A  . Number of Children: N/A  . Years of Education: N/A   Occupational History  . Not on file.   Social History Main Topics  . Smoking status: Never Smoker   . Smokeless tobacco: Never Used  . Alcohol Use: No  . Drug Use: No  . Sexual Activity: Yes   Other Topics Concern  . Not on file   Social History Narrative    FAMILY HISTORY: Family History  Problem Relation Age of Onset  . Stroke Neg Hx   . Heart disease Neg Hx   . Cancer Neg Hx     ALLERGIES:  is allergic to heparin.  MEDICATIONS:  Current Outpatient Prescriptions  Medication Sig Dispense Refill  . acetaminophen (TYLENOL) 325 MG tablet Take 650 mg by mouth every 6 (six) hours as needed.    Marland Kitchen ibuprofen (ADVIL,MOTRIN) 200 MG tablet Take 200 mg by mouth every 6 (six) hours as needed.    . Rivaroxaban 15 & 20 MG TBPK Take as directed on package: Start with one 15mg  tablet by mouth twice a day with food. On Day 22, switch to one 20mg  tablet once a day with food. 51 each 0   No current facility-administered medications for this visit.    REVIEW OF SYSTEMS:   Constitutional: Denies fevers, chills or abnormal night sweats Eyes: Denies blurriness of vision, double vision or watery eyes Ears, nose, mouth, throat, and face: Denies mucositis or sore throat Respiratory: Denies cough, dyspnea or wheezes Cardiovascular: Denies palpitation, chest discomfort  Gastrointestinal:  Denies nausea, heartburn or change in bowel habits Skin: Denies abnormal skin rashes Lymphatics: Denies  new lymphadenopathy or easy bruising Neurological:Denies numbness, tingling or new weaknesses Behavioral/Psych: Mood is stable, no new changes  All other systems were reviewed with the patient and are negative.  PHYSICAL EXAMINATION: ECOG PERFORMANCE STATUS: 1 - Symptomatic but completely ambulatory  Filed Vitals:   09/29/15 0844  BP: 121/37  Pulse: 94  Temp: 98.1 F (36.7 C)   Resp: 17   Filed Weights   09/29/15 0844  Weight: 128 lb 14.4 oz (58.469 kg)    GENERAL:alert, no distress and comfortable SKIN: skin color, texture, turgor are normal, no rashes or significant lesions EYES: normal, conjunctiva are pink and non-injected, sclera clear OROPHARYNX:no exudate, no erythema and lips, buccal mucosa, and tongue normal  NECK: supple, thyroid normal size, non-tender, without nodularity LYMPH:  no palpable lymphadenopathy in the cervical, axillary or inguinal LUNGS: clear to auscultation and percussion with normal breathing effort HEART: regular rate & rhythm and no murmurs With mild left lower extremity edema ABDOMEN:abdomen soft, non-tender and normal bowel sounds. Distended uterine fibroids is palpable, almost compatible to size of a third trimester pregnancy Musculoskeletal:no cyanosis of digits and no clubbing  PSYCH: alert & oriented x 3 with fluent speech NEURO: no focal motor/sensory deficits  LABORATORY DATA:  I have reviewed the data as listed Lab Results  Component Value Date   WBC 10.0 09/20/2015   HGB 10.2* 09/20/2015   HCT 32.3* 09/20/2015   MCV 85.0 09/20/2015   PLT 233 09/20/2015   Lab Results  Component Value Date   CREATININE 0.67 09/20/2015     RADIOGRAPHIC STUDIES: I reviewed imaging study with patient and her husband. I have personally reviewed the radiological images as listed and agreed with the findings in the report. Ct Angio Chest Pe W/cm &/or Wo Cm  09/20/2015  CLINICAL DATA:  40 year old female with a history of IVC and bilateral iliac deep venous thrombosis in 2011, now presenting with left upper extremity swelling and left lower extremity deep venous thrombosis on emergency department ultrasound. EXAM: CT ANGIOGRAPHY CHEST CT ABDOMEN AND PELVIS WITH CONTRAST TECHNIQUE: Multidetector CT imaging of the chest was performed using the standard protocol during bolus administration of intravenous contrast. Multiplanar CT image  reconstructions and MIPs were obtained to evaluate the vascular anatomy. Multidetector CT imaging of the abdomen and pelvis was performed using the standard protocol during bolus administration of intravenous contrast. CONTRAST:  100 cc Isovue 370 IV. COMPARISON:  09/06/2009 chest CT angiogram and 07/13/2009 CT abdomen/ pelvis. FINDINGS: CTA CHEST FINDINGS Mediastinum/Nodes: The study is high quality for the evaluation of pulmonary embolism. There are no filling defects in the central, lobar, segmental or subsegmental pulmonary artery branches to suggest acute pulmonary embolism. Great vessels are normal in course and caliber. Normal heart size. Trace pericardial fluid/thickening. Normal visualized thyroid. Normal esophagus. No pathologically enlarged axillary, mediastinal or hilar lymph nodes. Stable coarsely calcified left hilar lymph nodes from prior granulomatous disease. Lungs/Pleura: No pneumothorax. No pleural effusion. Stable 7 mm calcified granuloma in the posterior left lower lobe. No acute consolidative airspace disease, significant pulmonary nodules or lung masses. Musculoskeletal:  No aggressive appearing focal osseous lesions. CT ABDOMEN and PELVIS FINDINGS Hepatobiliary: There are 5 simple liver cysts scattered throughout the liver, largest 3.3 cm in the medial lower right liver lobe. There are numerous additional subcentimeter hypodense lesions scattered throughout the liver (greater than 20), which are too small to characterize, many of which were present on 07/13/2009, however these are mildly increased in size and number. Normal gallbladder with no  radiopaque cholelithiasis. No biliary ductal dilatation. Pancreas: Normal, with no mass or duct dilation. Spleen: Normal size. No mass. Adrenals/Urinary Tract: Normal adrenals. No hydronephrosis. No renal mass. Normal bladder. Stomach/Bowel: Grossly normal stomach. Normal caliber small bowel with no small bowel wall thickening. Normal appendix. Normal  large bowel with no diverticulosis, large bowel wall thickening or pericolonic fat stranding. Vascular/Lymphatic: Normal caliber abdominal aorta. Patent portal, splenic, hepatic and renal veins. The suprarenal IVC is patent. There is an infrarenal IVC stent extending inferiorly from the L3 level into the bilateral iliac veins, terminating in the proximal external iliac veins bilaterally. The infrarenal IVC is patent above the stent. There is nonocclusive chronic eccentric thrombus within the IVC portion of the stent. There is high-grade narrowing of the stented left common iliac vein by the markedly enlarged uterus. There is probable near occlusive thrombosis of the stented left common iliac vein. There is heterogeneous enhancement of the left external iliac vein and left common femoral vein with surrounding fat stranding and associated wall thickening in these veins, suggestive of acute nonocclusive thrombosis in these veins. There is nonocclusive peripheral chronic thrombosis of the stented right common iliac vein. There is high-grade narrowing of the right external iliac vein at the stent terminus due to extrinsic mass effect by the markedly enlarged uterus. No definite thrombosis within the right external iliac vein or right common femoral vein. There are a patent [place] spinous venous collaterals. There a few patent superficial venous collaterals in the anterior left proximal thigh and ventral pelvic wall. No pathologically enlarged lymph nodes in the abdomen or pelvis. Reproductive: The uterus is markedly enlarged, measuring 24.1 x 12.2 x 17.8 cm in size. There is a probable dominant 19.0 x 11.6 x 16.7 cm posterior uterine body transmural fibroid, which has markedly enlarged since 07/13/2009. No separate adnexal masses appreciated. The left ovarian vein is prominently asymmetrically enlarged. Other: No pneumoperitoneum, ascites or focal fluid collection. Musculoskeletal: No aggressive appearing focal osseous  lesions. Review of the MIP images confirms the above findings. IMPRESSION: 1. No pulmonary embolism.  No active disease in the chest. 2. Markedly enlarged myomatous uterus with a dominant 19.0 x 11.6 x 16.7 cm posterior uterine body transmural fibroid. 3. High-grade narrowing of the stented portions of the right external iliac vein and left common iliac vein due to extrinsic mass effect by the markedly enlarged uterus. 4. Acute deep venous thrombosis within the left common femoral vein, left external iliac vein and left common iliac vein, which appears non-occlusive to near-occlusive. 5. Chronic-appearing eccentric non-occlusive deep venous thrombosis within the stented portion of the infrarenal IVC and right iliac veins. The IVC above the stent and renal veins are patent. Electronically Signed   By: Ilona Sorrel M.D.   On: 09/20/2015 16:36   Ct Abdomen Pelvis W Contrast  09/20/2015  CLINICAL DATA:  40 year old female with a history of IVC and bilateral iliac deep venous thrombosis in 2011, now presenting with left upper extremity swelling and left lower extremity deep venous thrombosis on emergency department ultrasound. EXAM: CT ANGIOGRAPHY CHEST CT ABDOMEN AND PELVIS WITH CONTRAST TECHNIQUE: Multidetector CT imaging of the chest was performed using the standard protocol during bolus administration of intravenous contrast. Multiplanar CT image reconstructions and MIPs were obtained to evaluate the vascular anatomy. Multidetector CT imaging of the abdomen and pelvis was performed using the standard protocol during bolus administration of intravenous contrast. CONTRAST:  100 cc Isovue 370 IV. COMPARISON:  09/06/2009 chest CT angiogram and 07/13/2009 CT abdomen/  pelvis. FINDINGS: CTA CHEST FINDINGS Mediastinum/Nodes: The study is high quality for the evaluation of pulmonary embolism. There are no filling defects in the central, lobar, segmental or subsegmental pulmonary artery branches to suggest acute pulmonary  embolism. Great vessels are normal in course and caliber. Normal heart size. Trace pericardial fluid/thickening. Normal visualized thyroid. Normal esophagus. No pathologically enlarged axillary, mediastinal or hilar lymph nodes. Stable coarsely calcified left hilar lymph nodes from prior granulomatous disease. Lungs/Pleura: No pneumothorax. No pleural effusion. Stable 7 mm calcified granuloma in the posterior left lower lobe. No acute consolidative airspace disease, significant pulmonary nodules or lung masses. Musculoskeletal:  No aggressive appearing focal osseous lesions. CT ABDOMEN and PELVIS FINDINGS Hepatobiliary: There are 5 simple liver cysts scattered throughout the liver, largest 3.3 cm in the medial lower right liver lobe. There are numerous additional subcentimeter hypodense lesions scattered throughout the liver (greater than 20), which are too small to characterize, many of which were present on 07/13/2009, however these are mildly increased in size and number. Normal gallbladder with no radiopaque cholelithiasis. No biliary ductal dilatation. Pancreas: Normal, with no mass or duct dilation. Spleen: Normal size. No mass. Adrenals/Urinary Tract: Normal adrenals. No hydronephrosis. No renal mass. Normal bladder. Stomach/Bowel: Grossly normal stomach. Normal caliber small bowel with no small bowel wall thickening. Normal appendix. Normal large bowel with no diverticulosis, large bowel wall thickening or pericolonic fat stranding. Vascular/Lymphatic: Normal caliber abdominal aorta. Patent portal, splenic, hepatic and renal veins. The suprarenal IVC is patent. There is an infrarenal IVC stent extending inferiorly from the L3 level into the bilateral iliac veins, terminating in the proximal external iliac veins bilaterally. The infrarenal IVC is patent above the stent. There is nonocclusive chronic eccentric thrombus within the IVC portion of the stent. There is high-grade narrowing of the stented left  common iliac vein by the markedly enlarged uterus. There is probable near occlusive thrombosis of the stented left common iliac vein. There is heterogeneous enhancement of the left external iliac vein and left common femoral vein with surrounding fat stranding and associated wall thickening in these veins, suggestive of acute nonocclusive thrombosis in these veins. There is nonocclusive peripheral chronic thrombosis of the stented right common iliac vein. There is high-grade narrowing of the right external iliac vein at the stent terminus due to extrinsic mass effect by the markedly enlarged uterus. No definite thrombosis within the right external iliac vein or right common femoral vein. There are a patent [place] spinous venous collaterals. There a few patent superficial venous collaterals in the anterior left proximal thigh and ventral pelvic wall. No pathologically enlarged lymph nodes in the abdomen or pelvis. Reproductive: The uterus is markedly enlarged, measuring 24.1 x 12.2 x 17.8 cm in size. There is a probable dominant 19.0 x 11.6 x 16.7 cm posterior uterine body transmural fibroid, which has markedly enlarged since 07/13/2009. No separate adnexal masses appreciated. The left ovarian vein is prominently asymmetrically enlarged. Other: No pneumoperitoneum, ascites or focal fluid collection. Musculoskeletal: No aggressive appearing focal osseous lesions. Review of the MIP images confirms the above findings. IMPRESSION: 1. No pulmonary embolism.  No active disease in the chest. 2. Markedly enlarged myomatous uterus with a dominant 19.0 x 11.6 x 16.7 cm posterior uterine body transmural fibroid. 3. High-grade narrowing of the stented portions of the right external iliac vein and left common iliac vein due to extrinsic mass effect by the markedly enlarged uterus. 4. Acute deep venous thrombosis within the left common femoral vein, left external iliac vein  and left common iliac vein, which appears non-occlusive to  near-occlusive. 5. Chronic-appearing eccentric non-occlusive deep venous thrombosis within the stented portion of the infrarenal IVC and right iliac veins. The IVC above the stent and renal veins are patent. Electronically Signed   By: Ilona Sorrel M.D.   On: 09/20/2015 16:36    ASSESSMENT & PLAN:   Recurrent deep vein thrombosis (DVT) of left lower extremity ([place]) I have a long discussion with the patient and her husband. I felt that the recurrent DVT diagnosed recently was likely precipitated by prolonged immobilization with long car ride on background history of stent placement and possible component of May Thurner syndrome with compression of the left iliac veins from her large uterine fibroid. We discussed choice of anticoagulation therapy. She is doing well on Xarelto for now. Duration of anticoagulation therapy would be lifelong due to presence of stents and recurrent thrombosis even though her first episode of thrombosis was provoked by postoperative complication from emergency C-section in 2011. The presence of stents, while protective to maintain venous patency several years ago, could now predispose her to recurrence of clots in the future.  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 However, with recent extensive blood clot, I would recommend minimum 30 days of anticoagulation therapy without interruption, preferably 90 days if possible before we proceed with hysterectomy. She would need aggressive perioperative anticoagulation management with possible bridging therapy with Lovenox before surgery and to resume Lovenox after surgery to reduce the risks of postoperative complication She had questionable history of heparin-induced thrombocytopenia in the past although I could not see any real documentation related to this.  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 One could consider using fondaparinux as bridging therapy as opposed to Lovenox. I will discuss her case at the next hematology tumor board  Fibroid, uterine She has large and significant uterine fibroids compressing on vasculature in her abdomen and predispose her to recurrent DVT. She would benefit from hysterectomy but in view of recent blood clot, I will wait minimum of 30 days, preferably 60-90 days from recent diagnosis of DVT before we proceed with hysterectomy I will coordinate care with her surgeon/GYN for further discussion about plan of care and the timing of her future surgery  S/P angioplasty with stent The patient was not on any long-term anticoagulation therapy prior to the diagnosis of blood clot. Due to presence of the stents, she will remain on anticoagulation therapy indefinitely  Anemia in chronic illness This is likely anemia of chronic disease with possible component of iron deficiency. The patient denies recent history of bleeding such as epistaxis, hematuria or hematochezia. She is asymptomatic from the anemia. We will observe for now.  She does not require transfusion now. I recommend avoid NSAID if possible due to excessive risk of bleeding while on Xarelto.      All questions were answered. The patient knows to call the clinic with any problems, questions or concerns. I spent 55 minutes counseling the patient face to face. The total time spent in the appointment was 60 minutes and more than 50% was on counseling.     [place], [employer], MD 09/30/2015 10:23 AM

## 2015-09-30 NOTE — Assessment & Plan Note (Signed)
This is likely anemia of chronic disease with possible component of iron deficiency. The patient denies recent history of bleeding such as epistaxis, hematuria or hematochezia. She is asymptomatic from the anemia. We will observe for now.  She does not require transfusion now. I recommend avoid NSAID if possible due to excessive risk of bleeding while on Xarelto.

## 2015-10-06 ENCOUNTER — Telehealth: Payer: Self-pay | Admitting: *Deleted

## 2015-10-06 NOTE — Telephone Encounter (Signed)
Called Dr. Tonette Bihari office for Dr. Alvy Bimler.  She needs to speak w/ Dr. Ouida Sills regarding pt's case.  Dr. Calton Dach cell phone number given to [facility].  Heather says Dr. Ouida Sills is in hospital now and she will get message to him w/ Dr. Calton Dach phone number.

## 2015-10-12 ENCOUNTER — Encounter: Payer: Self-pay | Admitting: Hematology and Oncology

## 2015-10-12 ENCOUNTER — Telehealth: Payer: Self-pay | Admitting: *Deleted

## 2015-10-12 NOTE — Telephone Encounter (Signed)
Pt called back to speak to [place].  Noted her message & informed pt.  Pt will call surgeon for appts & knows to call Dr Alvy Bimler when she has dates of surgery.

## 2015-10-12 NOTE — Telephone Encounter (Signed)
LVM for pt to return nurse's call to discuss her case.   (Dr. Alvy Bimler presented her case at Hematology Board this morning.  Dr. Alvy Bimler also s/w Dr. Ouida Sills this morning.  Dr. Alvy Bimler recommends waiting 30 days from start of anticoag treatment to having surgery.  30 days will be around 6/29.  Dr. Ouida Sills aware and instructs for pt to contact his office for appt to discuss and schedule surgery.   When Dr. Alvy Bimler knows of surgical date, then she will bring pt back to our office to arrange bridging anticoagulation treatment from oral to injectable peri operatively.  )

## 2015-10-18 ENCOUNTER — Other Ambulatory Visit: Payer: Self-pay | Admitting: Obstetrics and Gynecology

## 2015-10-19 LAB — CYTOLOGY - PAP

## 2015-10-21 ENCOUNTER — Other Ambulatory Visit: Payer: Self-pay | Admitting: Hematology and Oncology

## 2015-10-21 ENCOUNTER — Telehealth: Payer: Self-pay | Admitting: Hematology and Oncology

## 2015-10-21 DIAGNOSIS — I82402 Acute embolism and thrombosis of unspecified deep veins of left lower extremity: Secondary | ICD-10-CM

## 2015-10-21 MED ORDER — FONDAPARINUX SODIUM 7.5 MG/0.6ML ~~LOC~~ SOLN: 7.5000 mg | SUBCUTANEOUS | Status: DC

## 2015-10-21 MED FILL — FONDAPARINUX 7.5 MG/0.6 ML: 7.5 | 7 days supply | Qty: 4 | Fill #0

## 2015-10-21 NOTE — Telephone Encounter (Signed)
I discussed perioperative DVT management with the patient and also with her gynecologist recently. My plan would be to stop her oral anticoagulation therapy minimum 48-72 hours before the date of surgery. Due to extensive high risk of recurrence of thrombosis, I will bridge her with Arixtra. 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 I will coordinate care with her gynecologist as soon as I know the OR date for hysterectomy. I addressed all her questions and concerns

## 2015-10-22 ENCOUNTER — Encounter: Payer: Self-pay | Admitting: *Deleted

## 2015-10-22 NOTE — Progress Notes (Signed)
New Rx for Arixtra faxed to [facility] outpatient pharmacy.

## 2015-10-27 ENCOUNTER — Telehealth: Payer: Self-pay | Admitting: *Deleted

## 2015-10-27 NOTE — Telephone Encounter (Signed)
Pt called to let Dr Alvy Bimler know her surgery with Dr Ouida Sills is scheduled for July 24 @ 1130

## 2015-10-28 ENCOUNTER — Telehealth: Payer: Self-pay | Admitting: *Deleted

## 2015-10-28 NOTE — Telephone Encounter (Signed)
I sent a prescription of Arixtra to [facility] recently Can you find out what is her copay?

## 2015-10-28 NOTE — Telephone Encounter (Signed)
Spoke with patient regarding appt for arixtra injection instructions. Pt states husband will be giving injections and was taught by pharmacist when they picked up the prescription. Will check with husband on comfort level of administration and will call us back.   Dr Alvy Bimler available 7/17 @ 1330 or 7/20 @ 1300 [phone]

## 2015-10-29 ENCOUNTER — Telehealth: Payer: Self-pay | Admitting: *Deleted

## 2015-10-29 ENCOUNTER — Telehealth: Payer: Self-pay | Admitting: Hematology and Oncology

## 2015-10-29 NOTE — Telephone Encounter (Signed)
Pt states her husband will be giving her injections. He was instructed by pharmacist on administration and feels competent without further instruction.

## 2015-10-29 NOTE — Telephone Encounter (Signed)
I spoke with the patient over the telephone and discussed with her GYN surgeon Dr. Ouida Sills regarding perioperative anticoagulation management. The patient has questionable history of diagnosis of HIT. She is at very high risk of recurrent DVT without bridging therapy. Although the data regarding bridging therapy with Arixtra is scarce, I was able to fine some support from literature from  [facility] Int. 2013 Aug; 110(31-32): 525-532.  Published online 2013 Aug 5. doi: 10.3238/arztebl.2013.0525  PMCID: [id number]  Review Article  The Perioperative Management of Treatment With Anticoagulants and Platelet Aggregation Inhibitors Oneita Jolly, Prof. Dr. Georgiann Hahn. MHA,*,1 Csilla Jmbor, Dr. Anitra Lauth Micael Hampshire, Prof. Dr. Bennetta Laos Arabella Merles, Prof. Dr. Court Joy Lorri Frederick, Dr. Verna Czech and Linus Orn, Prof. Dr. Georgiann Hahn.2  It is recommended the patient to hold Arixtra for minimum 36-42 hours prior to invasive surgery I recommend she takes the last dose of oral anticoagulation therapy on 11/11/2015. She will give herself daily dose of Arixtra as prescribed on July 21 and July 22. She will not take any form of anticoagulation therapy on 11/14/2015. She will proceed with surgery as planned on 11/15/2015. I will round on her the same evening after her surgery to determine timing of restarting her on anticoagulation treatment. She agreed with the plan of care

## 2015-11-02 ENCOUNTER — Other Ambulatory Visit: Payer: Self-pay | Admitting: Obstetrics and Gynecology

## 2015-11-09 ENCOUNTER — Telehealth: Payer: Self-pay | Admitting: *Deleted

## 2015-11-09 NOTE — Telephone Encounter (Signed)
"  I have a question I need to talk with Dr. Alvy Bimler.  Hysterectomy Surgery will be Monday, 11-15-2015.  For the past few weeks I've experienced painful needle sensation to the mid bottom half of my left leg, when I press on it or sit on my knee.  This pressure is extremely painful.  I don't feel anything if I don't touch it.  With my issues of blood clots should I be concerned about this.  Return number [phone]."

## 2015-11-09 NOTE — Telephone Encounter (Signed)
Further assessment and offered [facility] Visit tomorrow per [employer]. Amy Cole denies sensation of left leg 'falling asleep' but describes as "pins and needle sensation to mid-lower left leg .  I jdo not want to come in, I ust want to know if this is a red flag for surgery."   Advised she not cross legs, ankles or put pressure on lower extremities as this can decrease blood flow factoring into blood clots.  Advised she alert surgeon of sensation tomorrow with pre-surgical testing.  If she changes her mind about [facility] visit to call back tomorrow.

## 2015-11-09 NOTE — Telephone Encounter (Signed)
Provider out of office.  Triage will notify [facility] of this patient. 09-20-2015 Vascular study + DVT common femoral, profunda and popliteal veins of left lower extremity.  Patient reports left leg has been swollen for a while now and Dr. Alvy Bimler aware.  Started arixtra injections before surgery

## 2015-11-09 NOTE — Patient Instructions (Signed)
Your procedure is scheduled on:  Monday, November 15, 2015  Enter through the Main Entrance of [facility] at:  10:00 AM  Pick up the phone at the desk and dial [phone].  Call this number if you have problems the morning of surgery: [phone].  Remember: Do NOT eat food or drink after:  Midnight Sunday  Take these medicines the morning of surgery with a SIP OF WATER:  None  Stop taking Xarelto 3 days prior to sugery  Do NOT wear jewelry (body piercing), metal hair clips/bobby pins, make-up, or nail polish. Do NOT wear lotions, powders, or perfumes.  You may wear deodorant. Do NOT shave for 48 hours prior to surgery. Do NOT bring valuables to the hospital. Contacts, dentures, or bridgework may not be worn into surgery.  Leave suitcase in car.  After surgery it may be brought to your room.  For patients admitted to the hospital, checkout time is 11:00 AM the day of discharge.

## 2015-11-10 ENCOUNTER — Encounter (HOSPITAL_COMMUNITY): Payer: Self-pay

## 2015-11-10 ENCOUNTER — Encounter (HOSPITAL_COMMUNITY)
Admission: RE | Admit: 2015-11-10 | Discharge: 2015-11-10 | Disposition: A | Discharge status: Home / Self Care | Payer: BLUE CROSS/BLUE SHIELD | Source: Ambulatory Visit | Attending: Obstetrics and Gynecology | Admitting: Obstetrics and Gynecology

## 2015-11-10 DIAGNOSIS — Z01812 Encounter for preprocedural laboratory examination: Secondary | ICD-10-CM | POA: Diagnosis present

## 2015-11-10 HISTORY — DX: Other specified soft tissue disorders: M79.89

## 2015-11-10 HISTORY — DX: Acute embolism and thrombosis of unspecified deep veins of unspecified lower extremity: I82.409

## 2015-11-10 LAB — CBC
HEMATOCRIT: 27.5 % — AB (ref 36.0–46.0)
HEMOGLOBIN: 8.5 g/dL — AB (ref 12.0–15.0)
MCH: 25.7 pg — AB (ref 26.0–34.0)
MCHC: 30.9 g/dL (ref 30.0–36.0)
MCV: 83.1 fL (ref 78.0–100.0)
Platelets: 304 10*3/uL (ref 150–400)
RBC: 3.31 MIL/uL — AB (ref 3.87–5.11)
RDW: 15.1 % (ref 11.5–15.5)
WBC: 3.8 10*3/uL — ABNORMAL LOW (ref 4.0–10.5)

## 2015-11-10 NOTE — Pre-Procedure Instructions (Signed)
Reviewed Ms. Slayden's history with Dr. Sharlene Motts no new orders received at this time.

## 2015-11-12 ENCOUNTER — Other Ambulatory Visit: Payer: Self-pay | Admitting: Obstetrics and Gynecology

## 2015-11-15 ENCOUNTER — Encounter (HOSPITAL_COMMUNITY)
Admission: RE | Disposition: A | Discharge status: Home / Self Care | Payer: Self-pay | Source: Ambulatory Visit | Attending: Obstetrics and Gynecology

## 2015-11-15 ENCOUNTER — Inpatient Hospital Stay (HOSPITAL_COMMUNITY): Payer: BLUE CROSS/BLUE SHIELD | Admitting: Anesthesiology

## 2015-11-15 ENCOUNTER — Inpatient Hospital Stay (HOSPITAL_COMMUNITY)
Admission: RE | Admit: 2015-11-15 | Discharge: 2015-11-17 | DRG: 743 | Disposition: A | Discharge status: Home / Self Care | Payer: BLUE CROSS/BLUE SHIELD | Source: Ambulatory Visit | Attending: Obstetrics and Gynecology | Admitting: Obstetrics and Gynecology

## 2015-11-15 ENCOUNTER — Encounter (HOSPITAL_COMMUNITY): Payer: Self-pay | Admitting: Anesthesiology

## 2015-11-15 DIAGNOSIS — D219 Benign neoplasm of connective and other soft tissue, unspecified: Secondary | ICD-10-CM | POA: Diagnosis present

## 2015-11-15 DIAGNOSIS — N858 Other specified noninflammatory disorders of uterus: Secondary | ICD-10-CM | POA: Diagnosis not present

## 2015-11-15 DIAGNOSIS — Z98891 History of uterine scar from previous surgery: Secondary | ICD-10-CM

## 2015-11-15 DIAGNOSIS — Z79899 Other long term (current) drug therapy: Secondary | ICD-10-CM

## 2015-11-15 DIAGNOSIS — D638 Anemia in other chronic diseases classified elsewhere: Secondary | ICD-10-CM

## 2015-11-15 DIAGNOSIS — I82502 Chronic embolism and thrombosis of unspecified deep veins of left lower extremity: Secondary | ICD-10-CM

## 2015-11-15 DIAGNOSIS — Z888 Allergy status to other drugs, medicaments and biological substances status: Secondary | ICD-10-CM

## 2015-11-15 DIAGNOSIS — Z7901 Long term (current) use of anticoagulants: Secondary | ICD-10-CM

## 2015-11-15 DIAGNOSIS — I82402 Acute embolism and thrombosis of unspecified deep veins of left lower extremity: Secondary | ICD-10-CM

## 2015-11-15 DIAGNOSIS — D259 Leiomyoma of uterus, unspecified: Secondary | ICD-10-CM | POA: Diagnosis present

## 2015-11-15 DIAGNOSIS — Z86718 Personal history of other venous thrombosis and embolism: Secondary | ICD-10-CM

## 2015-11-15 DIAGNOSIS — Z955 Presence of coronary angioplasty implant and graft: Secondary | ICD-10-CM | POA: Diagnosis not present

## 2015-11-15 HISTORY — PX: HYSTERECTOMY ABDOMINAL WITH SALPINGECTOMY: SHX6725

## 2015-11-15 LAB — TYPE AND SCREEN
ABO/RH(D): B NEG
Antibody Screen: NEGATIVE

## 2015-11-15 LAB — HEMOGLOBIN: HEMOGLOBIN: 8.6 g/dL — AB (ref 12.0–15.0)

## 2015-11-15 SURGERY — HYSTERECTOMY, TOTAL, ABDOMINAL, WITH SALPINGECTOMY
Anesthesia: General | Laterality: Bilateral

## 2015-11-15 MED ORDER — LIDOCAINE HCL (CARDIAC) 20 MG/ML IV SOLN
INTRAVENOUS | Status: DC | PRN
Start: 2015-11-15 — End: 2015-11-15
  Administered 2015-11-15: 50 mg via INTRAVENOUS

## 2015-11-15 MED ORDER — OXYCODONE-ACETAMINOPHEN 5-325 MG PO TABS
1.0000 | ORAL_TABLET | ORAL | Status: DC | PRN
  Administered 2015-11-15 – 2015-11-16 (×4): 2 via ORAL
  Filled 2015-11-15 (×4): qty 2

## 2015-11-15 MED ORDER — ONDANSETRON HCL 4 MG PO TABS
4.0000 mg | ORAL_TABLET | Freq: Four times a day (QID) | ORAL | Status: DC | PRN
Start: 2015-11-15 — End: 2015-11-17

## 2015-11-15 MED ORDER — ROCURONIUM BROMIDE 100 MG/10ML IV SOLN
INTRAVENOUS | Status: DC | PRN
  Administered 2015-11-15 (×2): 10 mg via INTRAVENOUS
  Administered 2015-11-15: 40 mg via INTRAVENOUS

## 2015-11-15 MED ORDER — SCOPOLAMINE 1 MG/3DAYS TD PT72
MEDICATED_PATCH | TRANSDERMAL | Status: AC
  Administered 2015-11-15: 1.5 mg via TRANSDERMAL
  Filled 2015-11-15: qty 1

## 2015-11-15 MED ORDER — MEPERIDINE HCL 25 MG/ML IJ SOLN: 6.2500 mg | INTRAMUSCULAR | Status: DC | PRN

## 2015-11-15 MED ORDER — MORPHINE SULFATE (PF) 4 MG/ML IV SOLN
1.0000 mg | INTRAVENOUS | Status: DC | PRN
  Administered 2015-11-15: 3 mg via INTRAVENOUS
  Administered 2015-11-15: 1 mg via INTRAVENOUS
  Administered 2015-11-15: 2 mg via INTRAVENOUS

## 2015-11-15 MED ORDER — BUTORPHANOL TARTRATE 1 MG/ML IJ SOLN
1.0000 mg | INTRAMUSCULAR | Status: DC | PRN
  Administered 2015-11-15 – 2015-11-16 (×3): 1 mg via INTRAVENOUS
  Filled 2015-11-15 (×3): qty 1

## 2015-11-15 MED ORDER — KETOROLAC TROMETHAMINE 30 MG/ML IJ SOLN
INTRAMUSCULAR | Status: AC
  Filled 2015-11-15: qty 1

## 2015-11-15 MED ORDER — FENTANYL CITRATE (PF) 100 MCG/2ML IJ SOLN
INTRAMUSCULAR | Status: DC | PRN
  Administered 2015-11-15 (×3): 50 ug via INTRAVENOUS
  Administered 2015-11-15 (×2): 25 ug via INTRAVENOUS
  Administered 2015-11-15: 50 ug via INTRAVENOUS

## 2015-11-15 MED ORDER — ACETAMINOPHEN 10 MG/ML IV SOLN
1000.0000 mg | Freq: Once | INTRAVENOUS | Status: AC
  Administered 2015-11-15: 1000 mg via INTRAVENOUS
  Filled 2015-11-15: qty 100

## 2015-11-15 MED ORDER — DEXTROSE IN LACTATED RINGERS 5 % IV SOLN
INTRAVENOUS | Status: DC
  Administered 2015-11-15 – 2015-11-16 (×3): via INTRAVENOUS

## 2015-11-15 MED ORDER — FENTANYL CITRATE (PF) 250 MCG/5ML IJ SOLN
INTRAMUSCULAR | Status: AC
  Filled 2015-11-15: qty 5

## 2015-11-15 MED ORDER — METOCLOPRAMIDE HCL 5 MG/ML IJ SOLN
INTRAMUSCULAR | Status: AC
  Administered 2015-11-15: 10 mg via INTRAVENOUS
  Filled 2015-11-15: qty 2

## 2015-11-15 MED ORDER — MORPHINE SULFATE (PF) 10 MG/ML IV SOLN
INTRAVENOUS | Status: AC
  Filled 2015-11-15: qty 1

## 2015-11-15 MED ORDER — HYDROMORPHONE HCL 1 MG/ML IJ SOLN
INTRAMUSCULAR | Status: AC
  Administered 2015-11-15: 0.5 mg via INTRAVENOUS
  Filled 2015-11-15: qty 1

## 2015-11-15 MED ORDER — DEXAMETHASONE SODIUM PHOSPHATE 4 MG/ML IJ SOLN
INTRAMUSCULAR | Status: AC
  Filled 2015-11-15: qty 1

## 2015-11-15 MED ORDER — HYDROMORPHONE HCL 1 MG/ML IJ SOLN
0.2500 mg | INTRAMUSCULAR | Status: DC | PRN
  Administered 2015-11-15 (×4): 0.5 mg via INTRAVENOUS

## 2015-11-15 MED ORDER — SENNOSIDES-DOCUSATE SODIUM 8.6-50 MG PO TABS: 1.0000 | ORAL_TABLET | Freq: Every evening | ORAL | Status: DC | PRN

## 2015-11-15 MED ORDER — LIDOCAINE HCL (PF) 1 % IJ SOLN
INTRAMUSCULAR | Status: AC
  Filled 2015-11-15: qty 5

## 2015-11-15 MED ORDER — MORPHINE SULFATE (PF) 4 MG/ML IV SOLN
INTRAVENOUS | Status: AC
  Administered 2015-11-15: 2 mg via INTRAVENOUS
  Filled 2015-11-15: qty 1

## 2015-11-15 MED ORDER — LACTATED RINGERS IV SOLN
INTRAVENOUS | Status: DC
  Administered 2015-11-15: 13:00:00 via INTRAVENOUS
  Administered 2015-11-15: 125 mL/h via INTRAVENOUS

## 2015-11-15 MED ORDER — MORPHINE SULFATE (PF) 4 MG/ML IV SOLN
INTRAVENOUS | Status: AC
  Administered 2015-11-15: 3 mg via INTRAVENOUS
  Filled 2015-11-15: qty 1

## 2015-11-15 MED ORDER — MORPHINE SULFATE (PF) 4 MG/ML IV SOLN
4.0000 mg | Freq: Once | INTRAVENOUS | Status: AC
  Administered 2015-11-15: 2 mg via INTRAVENOUS

## 2015-11-15 MED ORDER — ONDANSETRON HCL 4 MG/2ML IJ SOLN
INTRAMUSCULAR | Status: AC
  Filled 2015-11-15: qty 2

## 2015-11-15 MED ORDER — EPHEDRINE SULFATE 50 MG/ML IJ SOLN
INTRAMUSCULAR | Status: DC | PRN
Start: 2015-11-15 — End: 2015-11-15
  Administered 2015-11-15: 10 mg via INTRAVENOUS

## 2015-11-15 MED ORDER — FONDAPARINUX SODIUM 2.5 MG/0.5ML ~~LOC~~ SOLN
2.5000 mg | Freq: Every day | SUBCUTANEOUS | Status: DC
  Administered 2015-11-15: 2.5 mg via SUBCUTANEOUS
  Filled 2015-11-15 (×2): qty 0.5

## 2015-11-15 MED ORDER — METHOCARBAMOL 1000 MG/10ML IJ SOLN
500.0000 mg | Freq: Once | INTRAVENOUS | Status: AC
  Administered 2015-11-15: 500 mg via INTRAVENOUS
  Filled 2015-11-15: qty 5

## 2015-11-15 MED ORDER — DEXAMETHASONE SODIUM PHOSPHATE 10 MG/ML IJ SOLN
INTRAMUSCULAR | Status: DC | PRN
  Administered 2015-11-15: 4 mg via INTRAVENOUS

## 2015-11-15 MED ORDER — ONDANSETRON HCL 4 MG/2ML IJ SOLN
INTRAMUSCULAR | Status: DC | PRN
  Administered 2015-11-15: 4 mg via INTRAVENOUS

## 2015-11-15 MED ORDER — ONDANSETRON HCL 4 MG/2ML IJ SOLN
4.0000 mg | Freq: Four times a day (QID) | INTRAMUSCULAR | Status: DC | PRN
  Administered 2015-11-15: 4 mg via INTRAVENOUS
  Filled 2015-11-15: qty 2

## 2015-11-15 MED ORDER — MIDAZOLAM HCL 2 MG/2ML IJ SOLN
INTRAMUSCULAR | Status: DC | PRN
  Administered 2015-11-15: 2 mg via INTRAVENOUS

## 2015-11-15 MED ORDER — SIMETHICONE 80 MG PO CHEW: 80.0000 mg | CHEWABLE_TABLET | Freq: Four times a day (QID) | ORAL | Status: DC | PRN

## 2015-11-15 MED ORDER — DEXAMETHASONE SODIUM PHOSPHATE 4 MG/ML IJ SOLN
INTRAMUSCULAR | Status: DC | PRN
  Administered 2015-11-15: 4 mg via INTRAVENOUS

## 2015-11-15 MED ORDER — PROPOFOL 10 MG/ML IV BOLUS
INTRAVENOUS | Status: AC
  Filled 2015-11-15: qty 20

## 2015-11-15 MED ORDER — PROPOFOL 10 MG/ML IV BOLUS
INTRAVENOUS | Status: DC | PRN
  Administered 2015-11-15: 150 mg via INTRAVENOUS

## 2015-11-15 MED ORDER — MIDAZOLAM HCL 2 MG/2ML IJ SOLN
INTRAMUSCULAR | Status: AC
  Filled 2015-11-15: qty 2

## 2015-11-15 MED ORDER — CEFAZOLIN SODIUM-DEXTROSE 2-3 GM-% IV SOLR
INTRAVENOUS | Status: DC | PRN
Start: 2015-11-15 — End: 2015-11-15
  Administered 2015-11-15: 2 g via INTRAVENOUS

## 2015-11-15 MED ORDER — EPHEDRINE 5 MG/ML INJ
INTRAVENOUS | Status: AC
  Filled 2015-11-15: qty 10

## 2015-11-15 MED ORDER — SCOPOLAMINE 1 MG/3DAYS TD PT72
1.0000 | MEDICATED_PATCH | Freq: Once | TRANSDERMAL | Status: DC
  Administered 2015-11-15: 1.5 mg via TRANSDERMAL

## 2015-11-15 MED ORDER — METOCLOPRAMIDE HCL 5 MG/ML IJ SOLN
10.0000 mg | Freq: Once | INTRAMUSCULAR | Status: AC | PRN
  Administered 2015-11-15: 10 mg via INTRAVENOUS

## 2015-11-15 MED ORDER — 0.9 % SODIUM CHLORIDE (POUR BTL) OPTIME
TOPICAL | Status: DC | PRN
  Administered 2015-11-15: 2000 mL

## 2015-11-15 MED ORDER — SUGAMMADEX SODIUM 200 MG/2ML IV SOLN
INTRAVENOUS | Status: DC | PRN
  Administered 2015-11-15: 121.8 mg via INTRAVENOUS

## 2015-11-15 SURGICAL SUPPLY — 34 items
CANISTER SUCT 3000ML (MISCELLANEOUS) ×3 IMPLANT
CLOTH BEACON ORANGE TIMEOUT ST (SAFETY) ×3 IMPLANT
CONT PATH 16OZ SNAP LID 3702 (MISCELLANEOUS) IMPLANT
CONT SPEC PATH 64OZ SNAP LID (MISCELLANEOUS) ×3 IMPLANT
DECANTER SPIKE VIAL GLASS SM (MISCELLANEOUS) IMPLANT
DRAPE WARM FLUID 44X44 (DRAPE) ×3 IMPLANT
DRSG OPSITE POSTOP 4X10 (GAUZE/BANDAGES/DRESSINGS) ×3 IMPLANT
DURAPREP 26ML APPLICATOR (WOUND CARE) ×3 IMPLANT
GAUZE SPONGE 4X4 16PLY XRAY LF (GAUZE/BANDAGES/DRESSINGS) IMPLANT
GLOVE BIOGEL PI IND STRL 7.0 (GLOVE) ×3 IMPLANT
GLOVE BIOGEL PI INDICATOR 7.0 (GLOVE) ×6
GLOVE ECLIPSE 7.0 STRL STRAW (GLOVE) ×6 IMPLANT
GOWN STRL REUS W/TWL LRG LVL3 (GOWN DISPOSABLE) ×9 IMPLANT
NEEDLE HYPO 22GX1.5 SAFETY (NEEDLE) IMPLANT
NS IRRIG 1000ML POUR BTL (IV SOLUTION) ×6 IMPLANT
PACK ABDOMINAL GYN (CUSTOM PROCEDURE TRAY) ×3 IMPLANT
PAD OB MATERNITY 4.3X12.25 (PERSONAL CARE ITEMS) ×3 IMPLANT
PENCIL SMOKE EVAC W/HOLSTER (ELECTROSURGICAL) ×3 IMPLANT
PROTECTOR NERVE ULNAR (MISCELLANEOUS) ×6 IMPLANT
SPONGE LAP 18X18 X RAY DECT (DISPOSABLE) ×6 IMPLANT
STAPLER VISISTAT 35W (STAPLE) ×3 IMPLANT
SUT MNCRL 0 MO-4 VIOLET 18 CR (SUTURE) ×3 IMPLANT
SUT MNCRL 0 VIOLET 6X18 (SUTURE) ×1 IMPLANT
SUT MNCRL AB 0 CT1 27 (SUTURE) ×6 IMPLANT
SUT MON AB 2-0 CT1 27 (SUTURE) ×3 IMPLANT
SUT MONOCRYL 0 6X18 (SUTURE) ×2
SUT MONOCRYL 0 MO 4 18  CR/8 (SUTURE) ×6
SUT PDS AB 0 CT 36 (SUTURE) ×12 IMPLANT
SUT SILK 3 0 SH 30 (SUTURE) IMPLANT
SYR CONTROL 10ML LL (SYRINGE) IMPLANT
TOWEL OR 17X24 6PK STRL BLUE (TOWEL DISPOSABLE) ×6 IMPLANT
TRAY FOLEY CATH SILVER 14FR (SET/KITS/TRAYS/PACK) ×3 IMPLANT
WATER STERILE IRR 1000ML POUR (IV SOLUTION) ×3 IMPLANT
YANKAUER SUCT BULB TIP NO VENT (SUCTIONS) ×3 IMPLANT

## 2015-11-15 NOTE — Anesthesia Preprocedure Evaluation (Addendum)
Anesthesia Evaluation  Patient identified by MRN, date of birth, ID band Patient awake    Reviewed: Allergy & Precautions, NPO status , Patient's Chart, lab work & pertinent test results  Airway Mallampati: II  TM Distance: >3 FB Neck ROM: Full    Dental no notable dental hx. (+) Teeth Intact   Pulmonary neg pulmonary ROS,    Pulmonary exam normal breath sounds clear to auscultation       Cardiovascular + Peripheral Vascular Disease  Normal cardiovascular exam Rhythm:Regular Rate:Normal  Hx/o DVT Left iliac vein May-Thurner syndrome S/P Thrombectomy right iliac artery S/P Aorto-iliac stent   Neuro/Psych negative neurological ROS  negative psych ROS   GI/Hepatic negative GI ROS, Neg liver ROS,   Endo/Other  negative endocrine ROS  Renal/GU negative Renal ROS  negative genitourinary   Musculoskeletal negative musculoskeletal ROS (+)   Abdominal Normal abdominal exam  (+)   Peds  Hematology  (+) anemia , Hx/o DVT LLE S/P Thrombectomy Chronic anticoagulation   Anesthesia Other Findings   Reproductive/Obstetrics Fibroid uterus                             Anesthesia Physical Anesthesia Plan  ASA: III  Anesthesia Plan: General   Post-op Pain Management:    Induction: Intravenous  Airway Management Planned: Oral ETT  Additional Equipment:   Intra-op Plan:   Post-operative Plan: Extubation in OR  Informed Consent: I have reviewed the patients History and Physical, chart, labs and discussed the procedure including the risks, benefits and alternatives for the proposed anesthesia with the patient or authorized representative who has indicated his/her understanding and acceptance.   Dental advisory given  Plan Discussed with: Anesthesiologist, Surgeon and CRNA  Anesthesia Plan Comments:         Anesthesia Quick Evaluation

## 2015-11-15 NOTE — Progress Notes (Deleted)
Post Partum Day 1 Subjective: no complaints, up ad lib, voiding, tolerating PO and + flatus  Objective: Blood pressure (!) 106/56, pulse 79, temperature 98.8 F (37.1 C), temperature source Oral, resp. rate 16, last menstrual period 11/05/2015, SpO2 99 %.  Physical Exam:  General: alert, cooperative and appears stated age Lochia: appropriate Uterine Fundus: firm DVT Evaluation: No evidence of DVT seen on physical exam. Negative Homan's sign. No cords or calf tenderness.  No results for input(s): HGB, HCT in the last 72 hours.  Assessment/Plan: Plan for discharge tomorrow and Breastfeeding   LOS: 0 days   Peggyann Zwiefelhofer, Henry 11/15/2015, 11:01 AM

## 2015-11-15 NOTE — Addendum Note (Signed)
Addendum  created 11/15/15 1433 by Josephine Igo, MD   Anesthesia Review and Sign - Ready for Procedure, Anesthesia Review and Sign - Signed, Sign clinical note

## 2015-11-15 NOTE — Anesthesia Postprocedure Evaluation (Signed)
Anesthesia Post Note  Patient: NIVEAH NOLLE  Procedure(s) Performed: Procedure(s) (LRB): HYSTERECTOMY ABDOMINAL WITH SALPINGECTOMY (Bilateral)  Patient location during evaluation: PACU Anesthesia Type: General Level of consciousness: awake and alert Pain management: pain level controlled Vital Signs Assessment: post-procedure vital signs reviewed and stable Respiratory status: spontaneous breathing, nonlabored ventilation, respiratory function stable and patient connected to nasal cannula oxygen Cardiovascular status: blood pressure returned to baseline and stable Postop Assessment: no signs of nausea or vomiting Anesthetic complications: no     Last Vitals:  Vitals:   11/15/15 1353 11/15/15 1404  BP:    Pulse: 61 66  Resp: 15 13  Temp:      Last Pain:  Vitals:   11/15/15 1404  TempSrc:   PainSc: 9    Pain Goal: Patients Stated Pain Goal: 3 (11/15/15 1007)               Harleyquinn Gasser A.

## 2015-11-15 NOTE — Anesthesia Procedure Notes (Signed)
Procedure Name: Intubation Date/Time: 11/15/2015 11:44 AM Performed by: Bufford Spikes Pre-anesthesia Checklist: Patient identified, Emergency Drugs available, Suction available, Patient being monitored and Timeout performed Patient Re-evaluated:Patient Re-evaluated prior to inductionOxygen Delivery Method: Circle system utilized Preoxygenation: Pre-oxygenation with 100% oxygen Intubation Type: IV induction Ventilation: Mask ventilation without difficulty Laryngoscope Size: Miller and 2 Grade View: Grade I Tube type: Oral Tube size: 7.0 mm Number of attempts: 1 Airway Equipment and Method: Stylet Placement Confirmation: ETT inserted through vocal cords under direct vision,  positive ETCO2 and breath sounds checked- equal and bilateral Secured at: 20 cm Tube secured with: Tape Dental Injury: Teeth and Oropharynx as per pre-operative assessment

## 2015-11-15 NOTE — Transfer of Care (Signed)
Immediate Anesthesia Transfer of Care Note  Patient: Amy Cole  Procedure(s) Performed: Procedure(s): HYSTERECTOMY ABDOMINAL WITH SALPINGECTOMY (Bilateral)  Patient Location: PACU  Anesthesia Type:General  Level of Consciousness: awake, alert  and oriented  Airway & Oxygen Therapy: Patient Spontanous Breathing and Patient connected to nasal cannula oxygen  Post-op Assessment: Report given to RN and Post -op Vital signs reviewed and stable  Post vital signs: Reviewed  Last Vitals:  Vitals:   11/15/15 1007  BP: (!) 106/56  Pulse: 79  Resp: 16  Temp: 37.1 C    Last Pain:  Vitals:   11/15/15 1007  TempSrc: Oral      Patients Stated Pain Goal: 3 ([id number])  Complications: No apparent anesthesia complications

## 2015-11-15 NOTE — Progress Notes (Signed)
Amy Cole   DOB:[birth year]   [id number]    Patient well known to me. She has history of significant, recurrent DVT. She also has May Turner syndrome secondary to large uterine fibroids causing venous compression, precipitated recurrence of DVT. Her anticoagulation therapy was discontinued in anticipation for planned hysterectomy today. She was seen at the recovery area after what appears to be an eventful hysterectomy The patient appears drowsy. Subjective: She has mild complaint of pain and nausea. There is no reported excessive bleeding after surgery  Objective:  Vitals:   11/15/15 1500 11/15/15 1503  BP: (!) 109/59   Pulse: 70 61  Resp: 15 13  Temp:       Intake/Output Summary (Last 24 hours) at 11/15/15 1535 Last data filed at 11/15/15 1455  Gross per 24 hour  Intake             2000 ml  Output              850 ml  Net             1150 ml    GENERAL:Semi-drowsy, Appears somewhat uncomfortable SKIN: skin color is pale, texture, turgor are normal, no rashes or significant lesions EYES: normal, Conjunctiva are pale and non-injected, sclera clear OROPHARYNX:no exudate, no erythema and lips, buccal mucosa, and tongue normal  NECK: supple, thyroid normal size, non-tender, without nodularity LYMPH:  no palpable lymphadenopathy in the cervical, axillary or inguinal LUNGS: clear to auscultation and percussion with normal breathing effort HEART: regular rate & rhythm and no murmurs and no lower extremity edema ABDOMEN:abdomen soft, non-tender and normal bowel sounds. Well-healed incision Musculoskeletal:no cyanosis of digits and no clubbing  NEURO: alert but not fully oriented, no focal motor/sensory deficits   Labs:  Lab Results  Component Value Date   WBC 3.8 (L) 11/10/2015   HGB 8.6 (L) 11/15/2015   HCT 27.5 (L) 11/10/2015   MCV 83.1 11/10/2015   PLT 304 11/10/2015   NEUTROABS 7.6 09/20/2015    Lab Results  Component Value Date   NA 136 09/20/2015   K 3.9  09/20/2015   CL 103 09/20/2015   CO2 25 09/20/2015   Assessment & Plan:   Recurrent deep vein thrombosis (DVT) of left lower extremity (HCC) I felt that the recurrent DVT diagnosed recently was likely precipitated by prolonged immobilization with long car ride on background history of stent placement and possible component of May Thurner syndrome with compression of the left iliac veins from her large uterine fibroid. Duration of anticoagulation therapy would be lifelong due to presence of stents and recurrent thrombosis even though her first episode of thrombosis was provoked by postoperative complication from emergency C-section in 2011. The presence of stents, while protective to maintain venous patency several years ago, could now predispose her to recurrence of clots in the future. 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 Due to presence of the stents, she will remain on anticoagulation therapy indefinitely  Anemia in chronic illness This is likely anemia of chronic disease with possible component of iron deficiency.  I plan to order iron studies tomorrow If her anemia is worse, she will benefit from either blood transfusion or IV iron while hospitalized  DVT  prophylaxis I will prescribe Arixtra as above  I will return to check on her tomorrow morning   Elzada Pytel, MD 11/15/2015  3:35 PM

## 2015-11-15 NOTE — H&P (Signed)
  Amy Cole is an 40 y.o.white female who presents for a TAH with bilateral salpingectomy for large uterine fibroids which are thought to be contributing to her recurrent DVTs. Pt was recently seen by Hematology for another DVT. At that time it was felt that the large size of the fibroids were compressing blood flow and increasing risk of DVTs. Pt also has heavy menses. Her hgb today is 8.5.  Chief Complaint: HPI:  Past Medical History:  Diagnosis Date  . DVT (deep vein thrombosis) in pregnancy 06/2009   extensive iliofemoral DVT, left lower extremity; s/p percutaneous thrombectomy done by Dr. Barbie Banner (07/18/2009), started on Coumadin at that time  . DVT (deep venous thrombosis) (HCC)    Left leg, currently  . Left leg swelling   . Palpitations 01/31/2010   on and off for past several weeks, now asymptomatic  . Uterine fibroid     Past Surgical History:  Procedure Laterality Date  . ABDOMINAL AORTA STENT    . CESAREAN SECTION  06/2009  . Chest stent     secondary to blood clots  . THROMBECTOMY  2003   done  By Dr. Barbie Banner    Family History  Problem Relation Age of Onset  . Stroke Neg Hx   . Heart disease Neg Hx   . Cancer Neg Hx    Social History:  reports that she has never smoked. She has never used smokeless tobacco. She reports that she does not drink alcohol or use drugs.  Allergies:  Allergies  Allergen Reactions  . Heparin Other (See Comments)    unknown    Medications Prior to Admission  Medication Sig Dispense Refill  . rivaroxaban (XARELTO) 20 MG TABS tablet Take 20 mg by mouth daily with supper.    Marland Kitchen acetaminophen (TYLENOL) 500 MG tablet Take 1,000 mg by mouth every 6 (six) hours as needed for mild pain.    . fondaparinux (ARIXTRA) 7.5 MG/0.6ML SOLN injection Inject 0.6 mLs (7.5 mg total) into the skin daily. 7 Syringe 0  . Rivaroxaban 15 & 20 MG TBPK Take as directed on package: Start with one 15mg  tablet by mouth twice a day with food. On Day 22, switch to  one 20mg  tablet once a day with food. (Patient not taking: Reported on 10/27/2015) 51 each 0       Blood pressure (!) 106/56, pulse 79, temperature 98.8 F (37.1 C), temperature source Oral, resp. rate 16, last menstrual period 11/05/2015, SpO2 99 %. General appearance: alert Lungs: clear to auscultation bilaterally Heart: regular rate and rhythm, S1, S2 normal, no murmur, click, rub or gallop Abdomen: 26 wk fibroids.    Lab Results  Component Value Date   WBC 3.8 (L) 11/10/2015   HGB 8.5 (L) 11/10/2015   HCT 27.5 (L) 11/10/2015   MCV 83.1 11/10/2015   PLT 304 11/10/2015   No results found for: PREGTESTUR, PREGSERUM, HCG, HCGQUANT    Patient Active Problem List   Diagnosis Date Noted  . S/P angioplasty with stent 09/30/2015  . Fibroid, uterine 09/30/2015  . May-Thurner syndrome 09/30/2015  . Anemia in chronic illness 09/30/2015  . Body aches 06/30/2010  . Recurrent deep vein thrombosis (DVT) of left lower extremity ([place]) 05/10/2010   IMP/ Large uterine fibriods         Recurrent DVTs Plan/ TAH with bilateral salpingectomy.  ANDERSON,MARK E 11/15/2015, 11:06 AM

## 2015-11-15 NOTE — Op Note (Signed)
[name], Amy Cole NO.:  [id number]  MEDICAL RECORD NO.:  [id number]  LOCATION:  [id number]                          FACILITY:  [facility]  PHYSICIAN:  Freda Munro, M.D.    DATE OF BIRTH:  1976/03/31  DATE OF PROCEDURE:  11/15/2015 DATE OF DISCHARGE:                              OPERATIVE REPORT   PREOPERATIVE DIAGNOSES: 1. Large symptomatic uterine fibroids. 2. Recurrent DVTs. 3. Anemia.  POSTOPERATIVE DIAGNOSES: 1. Large symptomatic uterine fibroids. 2. Recurrent DVTs. 3. Anemia.  PROCEDURE:  Total abdominal hysterectomy with bilateral salpingectomy.  SURGEON:  Freda Munro, M.D.  ASSISTANTIleene Rubens.  ANESTHESIA:  General.  ANTIBIOTICS:  Ancef 2 g.  DRAINS:  Foley bedside drainage.  ESTIMATED BLOOD LOSS:  200 mL.  SPECIMENS:  Cervix, uterus, and fallopian tube sent to Pathology.  FINDINGS:  The patient had normal ovaries bilaterally.  Her bowel appeared to be normal.  Fallopian tubes and ovaries were normal bilaterally.  The uterus had one large feather soft uterine fibroid.  PROCEDURE IN DETAIL:  The patient was taken to the operating room where general anesthetic was administered without difficulty.  She was then placed in dorsal supine position.  She was prepped and draped in usual fashion for this procedure.  A Foley catheter was placed in her bladder. A vertical skin incision was made from the pubic symphysis to approximately 2 cm above the umbilicus.  This was carried down to the fascia which was then opened.  Parietal peritoneum was grasped and entered sharply.  At this point, examination revealed this large uterus which was lifted up out of the abdominal cavity.  There were no significant adhesions in the abdominal cavity.  At this point, the round ligament on the patient's right was isolated, clamped, cut, and ligated with 0 Monocryl suture.  The mesosalpinx was cauterized on the right. Following this, the ovarian ligament was clamped, cut,  and ligated x2. Infundibulopelvic ligament was then freed from the uterus.  A similar procedure was performed on the opposite side.  The bladder flap was then taken down with sharp dissection.  The uterine vessels were bilaterally clamped, cut, and ligated with 0 Monocryl suture x2.  The cardinal ligaments were sterilely clamped, cut, and ligated with 0 Monocryl suture.  Once the level of the cervix was reached, the vagina was cross- clamped, and the specimen removed in total.  Cervix was examined and appeared the entire specimen was intact.  Following this, both vaginal angles were closed using 0 Monocryl suture in a Heaney fashion.  The remaining cuff was then closed in interrupted fashion using figure-of- eights and 0 Monocryl suture.  The pelvis and pedicles were copiously irrigated.  No bleeding was noted.  At this point, laps and retractor were removed.  The incision was closed using 0 PDS in a running Smead- Jones manner.  Sutures were brought through the middle incision and tied.  The subcuticular tissue was not closed.  Stainless steel clips were used to close the incision.  The patient was extubated and taken to her room in stable condition.  Instrument and lap count was correct x3.          ______________________________ Freda Munro,  M.D.     MA/MEDQ  D:  11/15/2015  T:  11/15/2015  Job:  [id number]

## 2015-11-16 ENCOUNTER — Other Ambulatory Visit: Payer: Self-pay | Admitting: Hematology and Oncology

## 2015-11-16 DIAGNOSIS — D638 Anemia in other chronic diseases classified elsewhere: Secondary | ICD-10-CM

## 2015-11-16 LAB — CBC WITH DIFFERENTIAL/PLATELET
BASOS ABS: 0 10*3/uL (ref 0.0–0.1)
Basophils Relative: 0 %
EOS ABS: 0 10*3/uL (ref 0.0–0.7)
Eosinophils Relative: 0 %
HCT: 25 % — ABNORMAL LOW (ref 36.0–46.0)
HEMOGLOBIN: 7.8 g/dL — AB (ref 12.0–15.0)
LYMPHS ABS: 1.9 10*3/uL (ref 0.7–4.0)
LYMPHS PCT: 20 %
MCH: 25.5 pg — AB (ref 26.0–34.0)
MCHC: 31.2 g/dL (ref 30.0–36.0)
MCV: 81.7 fL (ref 78.0–100.0)
Monocytes Absolute: 0.7 10*3/uL (ref 0.1–1.0)
Monocytes Relative: 7 %
NEUTROS PCT: 73 %
Neutro Abs: 6.9 10*3/uL (ref 1.7–7.7)
PLATELETS: 278 10*3/uL (ref 150–400)
RBC: 3.06 MIL/uL — AB (ref 3.87–5.11)
RDW: 15.2 % (ref 11.5–15.5)
WBC: 9.5 10*3/uL (ref 4.0–10.5)

## 2015-11-16 LAB — FERRITIN: FERRITIN: 6 ng/mL — AB (ref 11–307)

## 2015-11-16 LAB — IRON AND TIBC
Iron: 43 ug/dL (ref 28–170)
SATURATION RATIOS: 14 % (ref 10.4–31.8)
TIBC: 301 ug/dL (ref 250–450)
UIBC: 258 ug/dL

## 2015-11-16 MED ORDER — SODIUM CHLORIDE 0.9 % IV SOLN
510.0000 mg | Freq: Once | INTRAVENOUS | Status: DC
  Filled 2015-11-16: qty 17

## 2015-11-16 MED ORDER — FONDAPARINUX SODIUM 7.5 MG/0.6ML ~~LOC~~ SOLN
7.5000 mg | Freq: Every day | SUBCUTANEOUS | Status: DC
  Administered 2015-11-16: 7.5 mg via SUBCUTANEOUS
  Filled 2015-11-16 (×2): qty 0.6

## 2015-11-16 MED ORDER — HYDROMORPHONE HCL 2 MG PO TABS
2.0000 mg | ORAL_TABLET | ORAL | Status: DC | PRN
Start: 2015-11-16 — End: 2015-11-17
  Administered 2015-11-16 – 2015-11-17 (×4): 2 mg via ORAL
  Filled 2015-11-16 (×4): qty 1

## 2015-11-16 NOTE — Progress Notes (Signed)
POD#1 Pt sitting on side of bed. She is eating a regular diet. She has not had flatus yet. She has no vaginal bleeding.  VS hypotensive, not symptomatic. Making urine. ABD- soft,Incision dry IMP/ Stable Plan/ Getting Iron infusion now           D/C foley            Await path

## 2015-11-16 NOTE — Anesthesia Postprocedure Evaluation (Signed)
Anesthesia Post Note  Patient: Amy Cole  Procedure(s) Performed: Procedure(s) (LRB): HYSTERECTOMY ABDOMINAL WITH SALPINGECTOMY (Bilateral)  Patient location during evaluation: [facility] Anesthesia Type: General Level of consciousness: awake and alert Pain management: pain level controlled Vital Signs Assessment: post-procedure vital signs reviewed and stable Respiratory status: spontaneous breathing, nonlabored ventilation, respiratory function stable and patient connected to nasal cannula oxygen Cardiovascular status: blood pressure returned to baseline and stable Postop Assessment: no signs of nausea or vomiting Anesthetic complications: no     Last Vitals:  Vitals:   11/16/15 0207 11/16/15 0547  BP: (!) 96/46 (!) 92/46  Pulse: 77 (!) 55  Resp: 18 18  Temp: 36.9 C 36.9 C    Last Pain:  Vitals:   11/16/15 0547  TempSrc: Oral  PainSc:    Pain Goal: Patients Stated Pain Goal: 3 (11/16/15 0048)               Riki Sheer

## 2015-11-16 NOTE — Addendum Note (Signed)
Addendum  created 11/16/15 0959 by Riki Sheer, CRNA   Sign clinical note

## 2015-11-16 NOTE — Progress Notes (Signed)
Amy Cole   DOB:January 11, 1976   [id number]    Subjective: She is awake with her husband in the room. She complained of mild incision pain. The patient denies any recent signs or symptoms of bleeding such as spontaneous epistaxis, hematuria or hematochezia.   Objective:  Vitals:   11/16/15 0207 11/16/15 0547  BP: (!) 96/46 (!) 92/46  Pulse: 77 (!) 55  Resp: 18 18  Temp: 98.4 F (36.9 C) 98.5 F (36.9 C)     Intake/Output Summary (Last 24 hours) at 11/16/15 0859 Last data filed at 11/16/15 0600  Gross per 24 hour  Intake          4866.25 ml  Output             2350 ml  Net          2516.25 ml    GENERAL:alert, no distress and comfortable SKIN: skin color Is pale, texture, turgor are normal, no rashes or significant lesions EYES: normal, Conjunctiva are pale and non-injected, sclera clear ABDOMEN:abdomen soft, non-tender and normal bowel sounds. Incision looks clean without signs of bleeding Musculoskeletal:no cyanosis of digits and no clubbing  NEURO: alert & oriented x 3 with fluent speech, no focal motor/sensory deficits   Labs:  Lab Results  Component Value Date   WBC 9.5 11/16/2015   HGB 7.8 (L) 11/16/2015   HCT 25.0 (L) 11/16/2015   MCV 81.7 11/16/2015   PLT 278 11/16/2015   NEUTROABS 6.9 11/16/2015    Lab Results  Component Value Date   NA 136 09/20/2015   K 3.9 09/20/2015   CL 103 09/20/2015   CO2 25 09/20/2015    Assessment & Plan:   Recurrent deep vein thrombosis (DVT) of left lower extremity (HCC) I felt that the recurrent DVT diagnosed recently was likely precipitated by prolonged immobilization with long car ride on background history of stent placement and possible component of May Thurner syndrome with compression of the left iliac veins from her large uterine fibroid. Duration of anticoagulation therapy would be lifelong due to presence of stents and recurrent thrombosis even though her first episode of thrombosis was provoked by postoperative  complication from emergency C-section in 2011. The presence of stents, while protective to maintain venous patency several years ago, could now predispose her to recurrence of clots in the future. Her anticoagulation was interrupted in anticipation for planned hysterectomy today I will place her on prophylactic dose of Arixtra yesterday, will transition her to full dose tonight at 6 PM. 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 Due to presence of the stents, she will remain on anticoagulation therapy indefinitely  Anemia in chronic illness This is likely anemia of chronic disease with possible component of iron deficiency.  The most likely cause of her anemia is due to chronic blood loss/malabsorption syndrome. We discussed some of the risks, benefits, and alternatives of intravenous iron infusions. The patient is symptomatic from anemia and the iron level is critically low.  She tolerated oral iron supplement poorly and desires to achieved higher levels of iron faster for adequate hematopoesis. Some of the side-effects to be expected including risks of infusion reactions, phlebitis, headaches, nausea and fatigue.  The patient is willing to proceed.  I plan to make her return appointment next week to check on her blood count  DVT prophylaxis I will prescribe Arixtra as above  Discharge planning I would  defer to her primary gynecologist I will make her return appointment to my office next week    [facility], [place], MD 11/16/2015  8:59 AM

## 2015-11-17 MED ORDER — SENNOSIDES-DOCUSATE SODIUM 8.6-50 MG PO TABS: 1.0000 | ORAL_TABLET | Freq: Two times a day (BID) | ORAL | 0 refills | Status: DC

## 2015-11-17 MED ORDER — BISACODYL 10 MG RE SUPP
10.0000 mg | Freq: Once | RECTAL | Status: AC
  Administered 2015-11-17: 10 mg via RECTAL
  Filled 2015-11-17: qty 1

## 2015-11-17 MED ORDER — HYDROMORPHONE HCL 2 MG PO TABS: 2.0000 mg | ORAL_TABLET | ORAL | 0 refills | Status: DC | PRN

## 2015-11-17 NOTE — Progress Notes (Signed)
Pt. Is discharged in the care of husband. Downstairs per ambulatory  In Mother"s care, with N.T. Escort. Denies pain or discomfort. Stable. Abdominal dressing is clean and dry. No equipment needed for home use

## 2015-11-17 NOTE — Progress Notes (Signed)
POD#2 Pt states that she feels better today. Still with incisional pain. Ambulating well. No vaginal bleeding. Denies flatus but nursing service states that they did hear flatus. VSSAF Abd- incision dry, slightly distended, + BS IMP/ stable Plan/ Will discharge to home          REmove staples on mon

## 2015-11-17 NOTE — Progress Notes (Signed)
She is doing well apart from incisional pain. Tolerate iron supplements well. No excessive bleeding noted. She will continue Arixtra as directed and then switch over to Xarelto once her prescription runs out. Appointment is set up to see me back on August 1. Will sign off.

## 2015-11-19 NOTE — Discharge Summary (Signed)
  Pt is a 40 y/o white female who was admitted for a TAH secondary to uterine fibroids. For a complete description of the events that led up to this admission see the H&P. She underwent the procedure with out complications. Please see the op note. Post op course was benign. She was discharged to home on post op day number 2. Path was benign. She was ambulating and tolerating a diet at discharge. She had flatus. VSSAF. Incision was healing well. She will be seen in the office on Monday for staple removal.

## 2015-11-22 ENCOUNTER — Telehealth: Payer: Self-pay | Admitting: *Deleted

## 2015-11-22 ENCOUNTER — Telehealth: Payer: Self-pay | Admitting: Hematology and Oncology

## 2015-11-22 NOTE — Telephone Encounter (Signed)
Voicemail: "I'm scheduled for lab and MD visit with DR. Alvy Bimler tomorrow at 8:00 and 8:30.  I need to reschedule, please call me at [phone]."

## 2015-11-22 NOTE — Telephone Encounter (Signed)
lvm to inform pt of r/s 8/1 appt to 8/11 at 230 per pof

## 2015-11-23 ENCOUNTER — Other Ambulatory Visit: Payer: BLUE CROSS/BLUE SHIELD

## 2015-11-23 ENCOUNTER — Ambulatory Visit: Payer: BLUE CROSS/BLUE SHIELD | Admitting: Hematology and Oncology

## 2015-11-24 ENCOUNTER — Encounter (HOSPITAL_COMMUNITY): Payer: Self-pay | Admitting: Obstetrics and Gynecology

## 2015-12-03 ENCOUNTER — Encounter: Payer: Self-pay | Admitting: Hematology and Oncology

## 2015-12-03 ENCOUNTER — Ambulatory Visit (HOSPITAL_BASED_OUTPATIENT_CLINIC_OR_DEPARTMENT_OTHER): Payer: BLUE CROSS/BLUE SHIELD | Admitting: Hematology and Oncology

## 2015-12-03 ENCOUNTER — Other Ambulatory Visit (HOSPITAL_BASED_OUTPATIENT_CLINIC_OR_DEPARTMENT_OTHER): Payer: BLUE CROSS/BLUE SHIELD

## 2015-12-03 ENCOUNTER — Other Ambulatory Visit: Payer: Self-pay | Admitting: *Deleted

## 2015-12-03 DIAGNOSIS — D638 Anemia in other chronic diseases classified elsewhere: Secondary | ICD-10-CM

## 2015-12-03 DIAGNOSIS — D509 Iron deficiency anemia, unspecified: Secondary | ICD-10-CM

## 2015-12-03 DIAGNOSIS — Z7901 Long term (current) use of anticoagulants: Secondary | ICD-10-CM | POA: Diagnosis not present

## 2015-12-03 DIAGNOSIS — I82402 Acute embolism and thrombosis of unspecified deep veins of left lower extremity: Secondary | ICD-10-CM | POA: Diagnosis not present

## 2015-12-03 LAB — CBC & DIFF AND RETIC
BASO%: 0.4 % (ref 0.0–2.0)
BASOS ABS: 0 10*3/uL (ref 0.0–0.1)
EOS ABS: 0.1 10*3/uL (ref 0.0–0.5)
EOS%: 1.1 % (ref 0.0–7.0)
HCT: 34.7 % — ABNORMAL LOW (ref 34.8–46.6)
HEMOGLOBIN: 10.9 g/dL — AB (ref 11.6–15.9)
Immature Retic Fract: 2 % (ref 1.60–10.00)
LYMPH%: 21.3 % (ref 14.0–49.7)
MCH: 27.2 pg (ref 25.1–34.0)
MCHC: 31.4 g/dL — AB (ref 31.5–36.0)
MCV: 86.5 fL (ref 79.5–101.0)
MONO#: 0.4 10*3/uL (ref 0.1–0.9)
MONO%: 7.2 % (ref 0.0–14.0)
NEUT%: 70 % (ref 38.4–76.8)
NEUTROS ABS: 3.9 10*3/uL (ref 1.5–6.5)
Platelets: 435 10*3/uL — ABNORMAL HIGH (ref 145–400)
RBC: 4.01 10*6/uL (ref 3.70–5.45)
RDW: 17.7 % — AB (ref 11.2–14.5)
RETIC %: 1.48 % (ref 0.70–2.10)
RETIC CT ABS: 59.35 10*3/uL (ref 33.70–90.70)
WBC: 5.5 10*3/uL (ref 3.9–10.3)
lymph#: 1.2 10*3/uL (ref 0.9–3.3)

## 2015-12-03 NOTE — Assessment & Plan Note (Signed)
She has recent iron deficiency anemia. Her anemia is improved but I recommend she take oral iron supplement daily for at least 3-6 months

## 2015-12-03 NOTE — Progress Notes (Signed)
[facility] OFFICE PROGRESS NOTE  Heywood Bene, PA-C SUMMARY OF HEMATOLOGIC HISTORY:  Amy Cole 40 y.o. female is here because of recent diagnosis of recurrent left lower extremity DVT. I review her records extensively and collaborated the history with the patient. This patient has extensive left lower extremity requiring stent placement, angioplasty and thrombolytic therapy in 2011. Her extensive DVT happened after emergency C-section for her third child. Her first 2 children were born with spontaneous vaginal delivery. The patient is known to have large uterine fibroids and with her third pregnancy, she was not able to progress along with the normal spontaneous vaginal delivery. After the emergency C-section, she feels well and was discharged 2 or 3 days postoperatively. At home, she developed onset of fever and left lower extremity swelling. She saw her obstetrician regarding her symptoms and according to her, they did not do any further workup pertaining to that. She was subsequently admitted to the hospital after she was found to have significant left lower extremity blood clot. She was started on heparin therapy and there was evidence of thrombocytopenia. Someone raised the possible diagnosis of heparin-induced thrombocytopenia although I did not see a HIT panel being drawn. Unfortunately, I was not able to review those records.  She had multiple sessions of thrombolytic therapy between 07/13/2015 to 07/19/2015. On the last day of the procedure on 07/19/2015, the interventional radiologist commented on this: "After 3 days of gentle lysis, there was some improvement but residual chronic and acute thrombus. The Angiojet device was utilized with angioplasty alone resulting in further improvement but persistent narrowing of the bilateral iliac venous system as well as some scarring in the lower IVC. Stents were placed from the external iliac veins to the IVC at  the L3 level, well below the renal vein inflow. This resulted in a satisfactory result with antegrade flow bilaterally and patency. Life long anticoagulation is recommended to ensure patency of the iliac venous system" The patient was subsequently discharged home on warfarin therapy.  She almost completed one year's worth of anticoagulation therapy without complications. Subsequently, she saw an internist around February 2012 who recommended consideration for stopping anticoagulation therapy. She stopped warfarin therapy around March 2012.  She has been doing well ever since then until recently. She drove to [place] to visit family members between 09/10/2015 to 09/13/2015. It was a 10 hours' drive each way and she remembers stopping periodically. Soon after that, she complained of left lower extremity pain and edema. She was taking Tylenol and ibuprofen but that did not improve her symptoms.  On 09/20/2015, she presented to the emergency department for further evaluation. Ultrasound venous Doppler which showed findings consistent with acute deep vein thrombosis involving the common femoral, femoral, profunda, and popliteal veins of the left lower extremity. CT angiogram was performed which showed no pulmonary embolism. 2. Markedly enlarged myomatous uterus with a dominant 19.0 x 11.6 x 16.7 cm posterior uterine body transmural fibroid. 3. High-grade narrowing of the stented portions of the right external iliac vein and left common iliac vein due to extrinsic mass effect by the markedly enlarged uterus. 4. Acute deep venous thrombosis within the left common femoral vein, left external iliac vein and left common iliac vein, which appears non-occlusive to near-occlusive. 5. Chronic-appearing eccentric non-occlusive deep venous thrombosis within the stented portion of the infrarenal IVC and right iliac veins. The IVC above the stent and renal veins are patent.  She was discharged from the emergency  department on Xarelto.  She denies bleeding complication. She continues to take Tylenol and ibuprofen in an alternative fashion every 6 hours for left lower extremity discomfort. She has mild limitation of physical activity due to that.  From the uterine fibroid standpoint, she has heavy menstruation. She has heavy periods for 5 days with cycle every 26 days. She follows with a gynecologist with no definitive plan for hysterectomy prior to recent diagnosis of blood clot. With changes of her menstrual cycle, she has periodic intermittent swelling of her abdomen She denies recent chest pain on exertion, shortness of breath on minimal exertion, pre-syncopal episodes, hemoptysis, or palpitation. She denies recent history of trauma, dehydration, recent surgery, smoking or prolonged immobilization. She had no prior history or diagnosis of cancer. Her age appropriate screening programs are up-to-date. The patient had been pregnant before and denies history of peripartum thromboembolic event or history of recurrent miscarriages until 2011 as above. There is no family history of blood clots or miscarriages. On 11/15/2015, she underwent hysterectomy INTERVAL HISTORY: Amy Cole 40 y.o. female returns for further follow-up. She is doing well since surgery. The patient denies any recent signs or symptoms of bleeding such as spontaneous epistaxis, hematuria or hematochezia. Denies pain  I have reviewed the past medical history, past surgical history, social history and family history with the patient and they are unchanged from previous note.  ALLERGIES:  is allergic to heparin.  MEDICATIONS:  Current Outpatient Prescriptions  Medication Sig Dispense Refill  . rivaroxaban (XARELTO) 20 MG TABS tablet Take 20 mg by mouth daily.     No current facility-administered medications for this visit.      REVIEW OF SYSTEMS:   Constitutional: Denies fevers, chills or night sweats Eyes: Denies  blurriness of vision Ears, nose, mouth, throat, and face: Denies mucositis or sore throat Respiratory: Denies cough, dyspnea or wheezes Cardiovascular: Denies palpitation, chest discomfort or lower extremity swelling Gastrointestinal:  Denies nausea, heartburn or change in bowel habits Skin: Denies abnormal skin rashes Lymphatics: Denies new lymphadenopathy or easy bruising Neurological:Denies numbness, tingling or new weaknesses Behavioral/Psych: Mood is stable, no new changes  All other systems were reviewed with the patient and are negative.  PHYSICAL EXAMINATION: ECOG PERFORMANCE STATUS: 0 - Asymptomatic  Vitals:   12/03/15 1505  BP: 119/65  Pulse: 78  Resp: 18   Filed Weights   12/03/15 1505  Weight: 125 lb 6.4 oz (56.9 kg)    GENERAL:alert, no distress and comfortable SKIN: skin color, texture, turgor are normal, no rashes or significant lesionsHer incision wound is healing well EYES: normal, Conjunctiva are pale and non-injected, sclera clear Musculoskeletal:no cyanosis of digits and no clubbing  NEURO: alert & oriented x 3 with fluent speech, no focal motor/sensory deficits  LABORATORY DATA:  I have reviewed the data as listed     Component Value Date/Time   NA 136 09/20/2015 1315   K 3.9 09/20/2015 1315   CL 103 09/20/2015 1315   CO2 25 09/20/2015 1315   GLUCOSE 102 (H) 09/20/2015 1315   BUN 9 09/20/2015 1315   CREATININE 0.67 09/20/2015 1315   CREATININE 0.67 06/30/2010 1653   CALCIUM 9.1 09/20/2015 1315   PROT 7.6 06/30/2010 1653   ALBUMIN 5.0 06/30/2010 1653   AST 11 06/30/2010 1653   ALT 10 06/30/2010 1653   ALKPHOS 51 06/30/2010 1653   BILITOT 0.7 06/30/2010 1653   GFRNONAA >60 09/20/2015 1315   GFRAA >60 09/20/2015 1315    No results found for: SPEP, UPEP  Lab  Results  Component Value Date   WBC 5.5 12/03/2015   NEUTROABS 3.9 12/03/2015   HGB 10.9 (L) 12/03/2015   HCT 34.7 (L) 12/03/2015   MCV 86.5 12/03/2015   PLT 435 (H) 12/03/2015       Chemistry      Component Value Date/Time   NA 136 09/20/2015 1315   K 3.9 09/20/2015 1315   CL 103 09/20/2015 1315   CO2 25 09/20/2015 1315   BUN 9 09/20/2015 1315   CREATININE 0.67 09/20/2015 1315   CREATININE 0.67 06/30/2010 1653      Component Value Date/Time   CALCIUM 9.1 09/20/2015 1315   ALKPHOS 51 06/30/2010 1653   AST 11 06/30/2010 1653   ALT 10 06/30/2010 1653   BILITOT 0.7 06/30/2010 1653       ASSESSMENT & PLAN:  Recurrent deep vein thrombosis (DVT) of left lower extremity (HCC) I felt that the recurrent DVT diagnosed recently was likely precipitated by prolonged immobilization with long car ride on background history of stent placement and possible component of May Thurner syndrome with compression of the left iliac veins from her large uterine fibroid. She had recent hysterectomy and is doing well We discussed choice of anticoagulation therapy. She is doing well on Xarelto for now. Duration of anticoagulation therapy would be lifelong due to presence of stents and recurrent thrombosis even though her first episode of thrombosis was provoked by postoperative complication from emergency C-section in 2011. The presence of stents, while protective to maintain venous patency several years ago, could now predispose her to recurrence of clots in the future. I do not need to see her back unless she needs perioperative anticoagulation management in the future She needs minimum once a year hemoglobin and creatinine clearance checked through her primary care doctor's office  Anemia in chronic illness She has recent iron deficiency anemia. Her anemia is improved but I recommend she take oral iron supplement daily for at least 3-6 months   All questions were answered. The patient knows to call the clinic with any problems, questions or concerns. No barriers to learning was detected.  I spent 15 minutes counseling the patient face to face. The total time spent in the  appointment was 20 minutes and more than 50% was on counseling.     [facility], Lalani Winkles, MD 8/11/20174:15 PM

## 2015-12-03 NOTE — Assessment & Plan Note (Addendum)
I felt that the recurrent DVT diagnosed recently was likely precipitated by prolonged immobilization with long car ride on background history of stent placement and possible component of May Thurner syndrome with compression of the left iliac veins from her large uterine fibroid. She had recent hysterectomy and is doing well We discussed choice of anticoagulation therapy. She is doing well on Xarelto for now. Duration of anticoagulation therapy would be lifelong due to presence of stents and recurrent thrombosis even though her first episode of thrombosis was provoked by postoperative complication from emergency C-section in 2011. The presence of stents, while protective to maintain venous patency several years ago, could now predispose her to recurrence of clots in the future. I do not need to see her back unless she needs perioperative anticoagulation management in the future She needs minimum once a year hemoglobin and creatinine clearance checked through her primary care doctor's office

## 2017-01-30 IMAGING — CT CT ABD-PELV W/ CM
2 of 5 series · 12 of 46 positions shown, 14 images · IV contrast (APPLIED)
Comparison: 09/06/2009 chest CT angiogram and 07/13/2009 CT
abdomen/ pelvis.

CLINICAL DATA: 40-year-old female with a history of IVC and
bilateral iliac deep venous thrombosis in 3788, now presenting with
left upper extremity swelling and left lower extremity deep venous
thrombosis on emergency department ultrasound.



[Series 3: abd/ pelvis 5.0 i30f 1 · axial · 0.68mm/px · z∈[+696,+1162]mm · 9 of 117 slices shown, 11 images]
[im 12/117  soft-tissue]
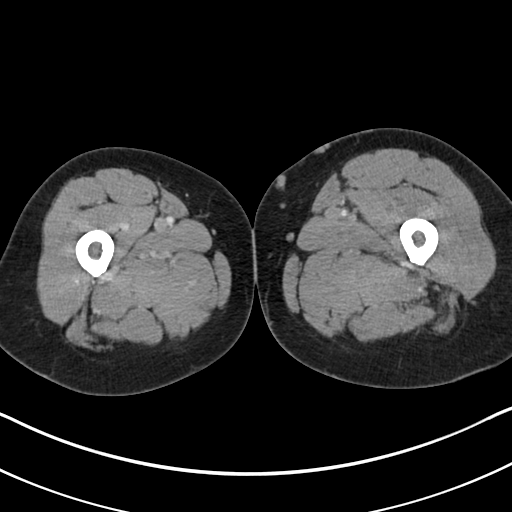
[im 12/117  bone]
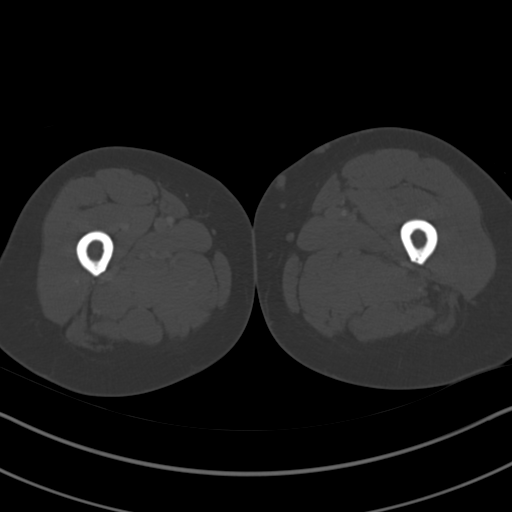
[im 23/117  soft-tissue]
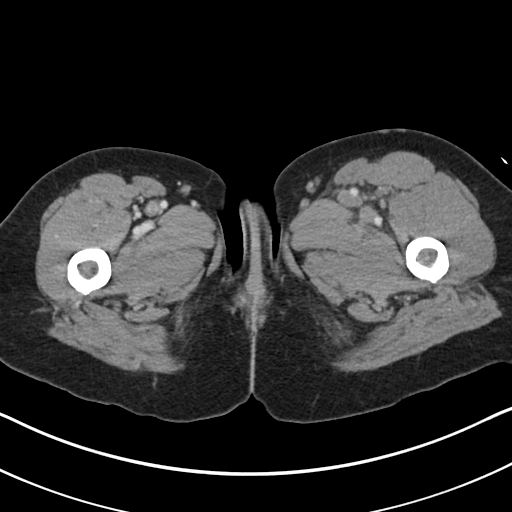
[im 34/117  soft-tissue]
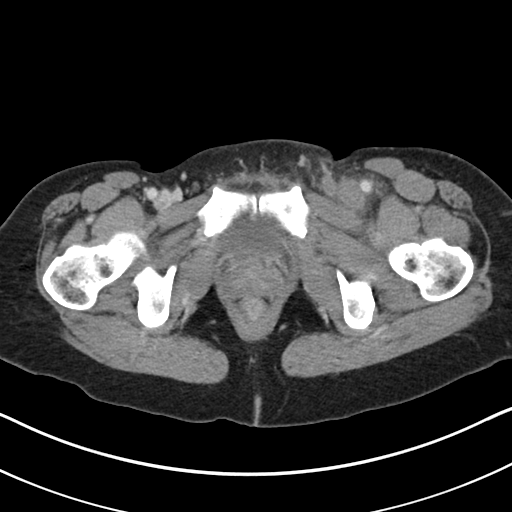
[im 45/117  soft-tissue]
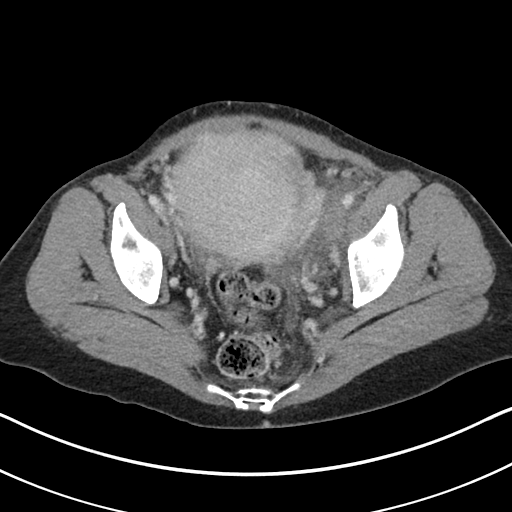
[im 61/117  soft-tissue]
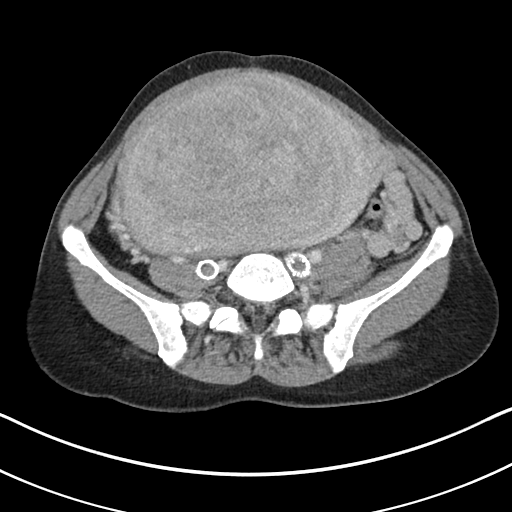
[im 72/117  soft-tissue]
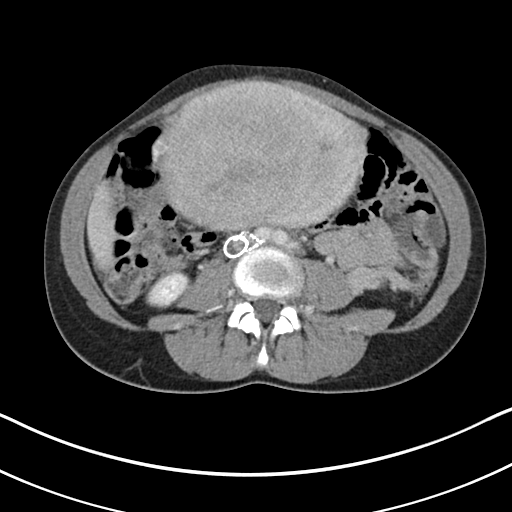
[im 83/117  soft-tissue]
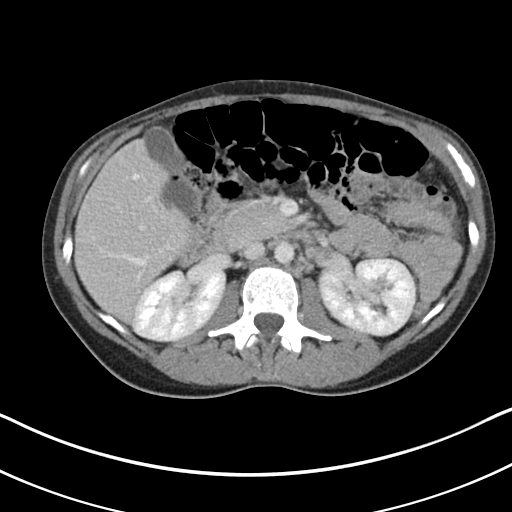
[im 94/117  soft-tissue]
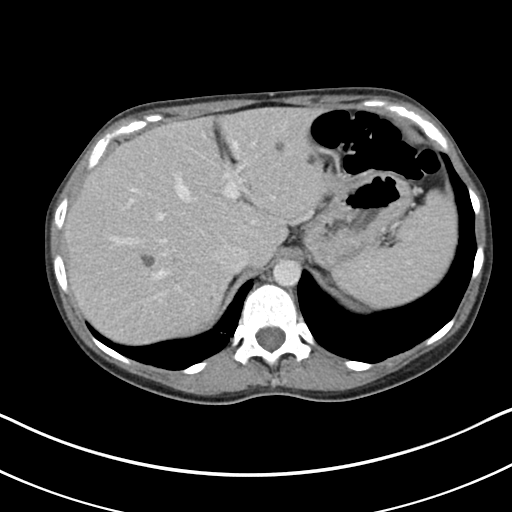
[im 105/117  soft-tissue]
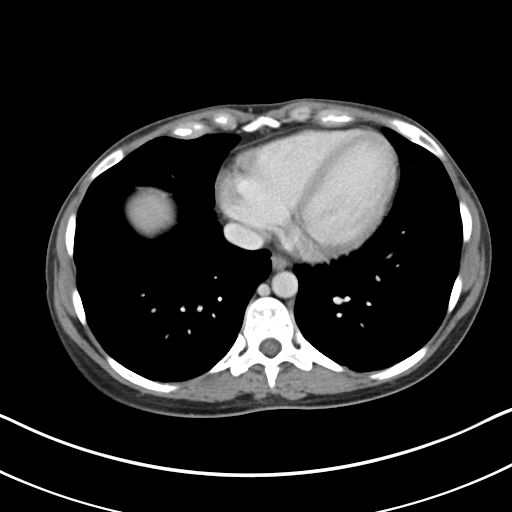
[im 105/117  bone]
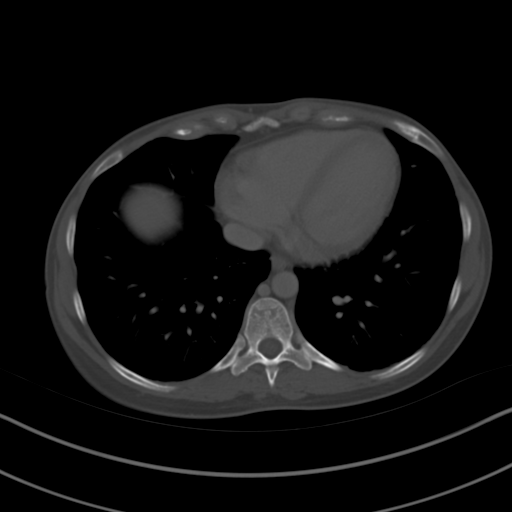

[Series 6: coronals · coronal · 0.69mm/px · 3 of 75 slices shown]
[im 25/75  soft-tissue]
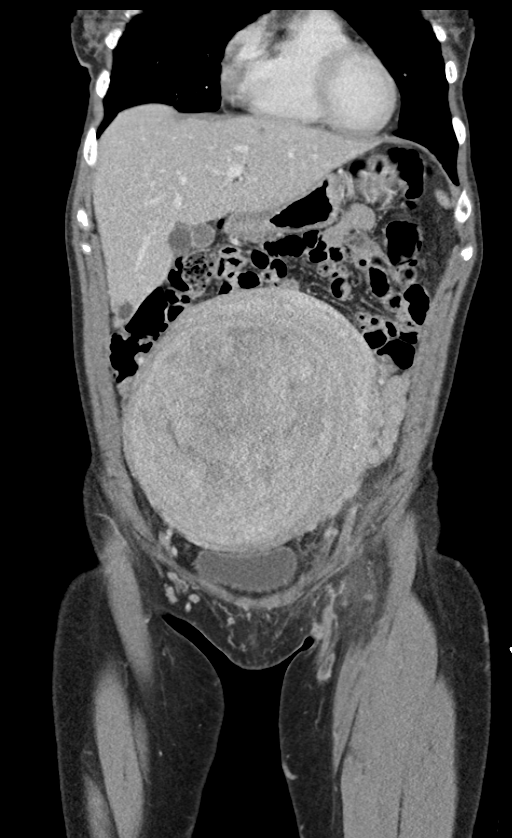
[im 33/75  soft-tissue]
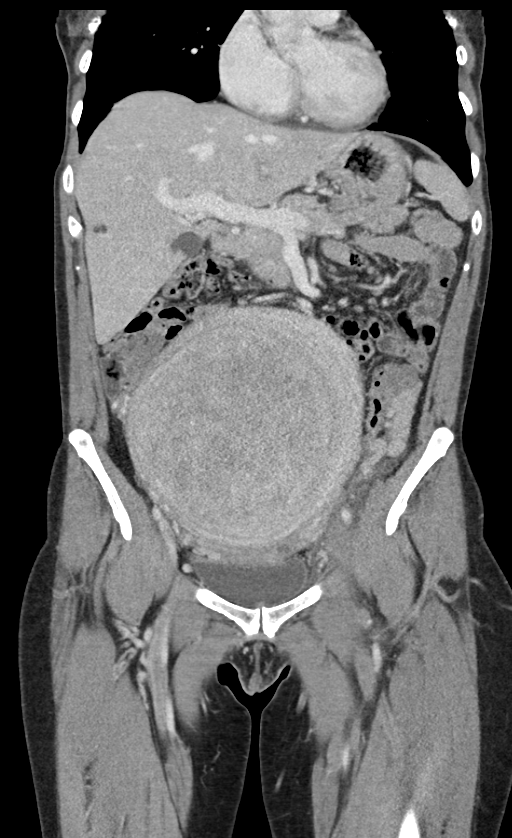
[im 42/75  soft-tissue]
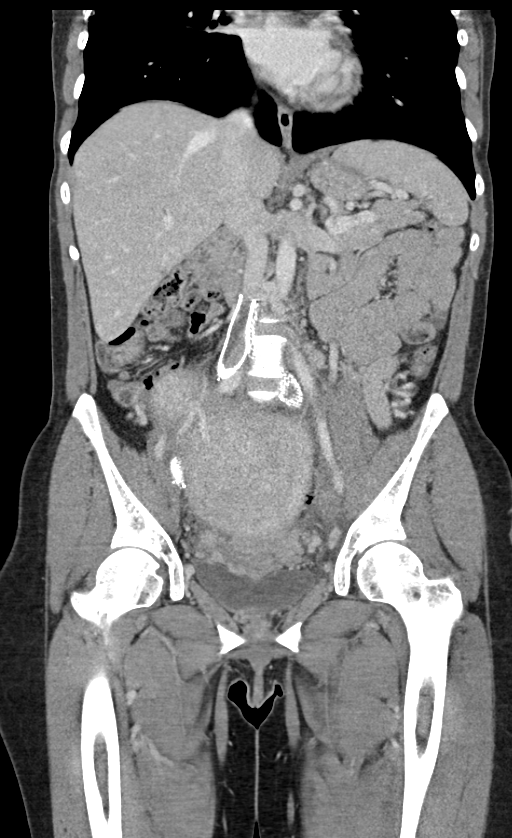

[12 of 46 positions shown; findings below may reference images not displayed]

FINDINGS: CTA CHEST FINDINGS

Mediastinum/Nodes: The study is high quality for the evaluation of
pulmonary embolism. There are no filling defects in the central,
lobar, segmental or subsegmental pulmonary artery branches to
suggest acute pulmonary embolism. Great vessels are normal in course
and caliber. Normal heart size. Trace pericardial fluid/thickening.
Normal visualized thyroid. Normal esophagus. No pathologically
enlarged axillary, mediastinal or hilar lymph nodes. Stable coarsely
calcified left hilar lymph nodes from prior granulomatous disease.

Lungs/Pleura: No pneumothorax. No pleural effusion. Stable 7 mm
calcified granuloma in the posterior left lower lobe. No acute
consolidative airspace disease, significant pulmonary nodules or
lung masses.

Musculoskeletal:  No aggressive appearing focal osseous lesions.

CT ABDOMEN and PELVIS FINDINGS

Hepatobiliary: There are 5 simple liver cysts scattered throughout
the liver, largest 3.3 cm in the medial lower right liver lobe.
There are numerous additional subcentimeter hypodense lesions
scattered throughout the liver (greater than 20), which are too
small to characterize, many of which were present on 07/13/2009,
however these are mildly increased in size and number. Normal
gallbladder with no radiopaque cholelithiasis. No biliary ductal
dilatation.

Pancreas: Normal, with no mass or duct dilation.

Spleen: Normal size. No mass.

Adrenals/Urinary Tract: Normal adrenals. No hydronephrosis. No renal
mass. Normal bladder.

Stomach/Bowel: Grossly normal stomach. Normal caliber small bowel
with no small bowel wall thickening. Normal appendix. Normal large
bowel with no diverticulosis, large bowel wall thickening or
pericolonic fat stranding.

Vascular/Lymphatic: Normal caliber abdominal aorta. Patent portal,
splenic, hepatic and renal veins. The suprarenal IVC is patent.
There is an infrarenal IVC stent extending inferiorly from the L3
level into the bilateral iliac veins, terminating in the proximal
external iliac veins bilaterally. The infrarenal IVC is patent above
the stent. There is nonocclusive chronic eccentric thrombus within
the IVC portion of the stent. There is high-grade narrowing of the
stented left common iliac vein by the markedly enlarged uterus.
There is probable near occlusive thrombosis of the stented left
common iliac vein. There is heterogeneous enhancement of the left
external iliac vein and left common femoral vein with surrounding
fat stranding and associated wall thickening in these veins,
suggestive of acute nonocclusive thrombosis in these veins. There is
nonocclusive peripheral chronic thrombosis of the stented right
common iliac vein. There is high-grade narrowing of the right
external iliac vein at the stent terminus due to extrinsic mass
effect by the markedly enlarged uterus. No definite thrombosis
within the right external iliac vein or right common femoral vein.
There are a patent Don Lolito spinous venous collaterals. There a few
patent superficial venous collaterals in the anterior left proximal
thigh and ventral pelvic wall. No pathologically enlarged lymph
nodes in the abdomen or pelvis.

Reproductive: The uterus is markedly enlarged, measuring 24.1 x
x 17.8 cm in size. There is a probable dominant 19.0 x 11.6 x
cm posterior uterine body transmural fibroid, which has markedly
enlarged since 07/13/2009. No separate adnexal masses appreciated.
The left ovarian vein is prominently asymmetrically enlarged.

Other: No pneumoperitoneum, ascites or focal fluid collection.

Musculoskeletal: No aggressive appearing focal osseous lesions.

Review of the MIP images confirms the above findings.
IMPRESSION: 1. No pulmonary embolism.  No active disease in the chest.
2. Markedly enlarged myomatous uterus with a dominant 19.0 x 11.6 x
16.7 cm posterior uterine body transmural fibroid.
3. High-grade narrowing of the stented portions of the right
external iliac vein and left common iliac vein due to extrinsic mass
effect by the markedly enlarged uterus.
4. Acute deep venous thrombosis within the left common femoral vein,
left external iliac vein and left common iliac vein, which appears
non-occlusive to near-occlusive.
5. Chronic-appearing eccentric non-occlusive deep venous thrombosis
within the stented portion of the infrarenal IVC and right iliac
veins. The IVC above the stent and renal veins are patent.
